# Patient Record
Sex: Female | Born: 1950 | Race: White | Hispanic: No | Smoking: Never smoker
Health system: Southern US, Community
[De-identification: ages and names within clinical notes are randomized; demographics above are authoritative.]

## PROBLEM LIST (undated history)

## (undated) DIAGNOSIS — E039 Hypothyroidism, unspecified: Secondary | ICD-10-CM

## (undated) HISTORY — PX: CLOSED REDUCTION SHOULDER DISLOCATION: SUR242

## (undated) HISTORY — PX: BREAST BIOPSY: SHX20

---

## 1997-09-18 ENCOUNTER — Other Ambulatory Visit: Admission: RE | Admit: 1997-09-18 | Discharge: 1997-09-18 | Payer: Self-pay | Admitting: Obstetrics and Gynecology

## 2018-01-30 ENCOUNTER — Other Ambulatory Visit: Payer: Self-pay | Admitting: Family Medicine

## 2018-01-30 DIAGNOSIS — Z1231 Encounter for screening mammogram for malignant neoplasm of breast: Secondary | ICD-10-CM

## 2018-01-30 DIAGNOSIS — Z13820 Encounter for screening for osteoporosis: Secondary | ICD-10-CM

## 2018-02-25 ENCOUNTER — Encounter: Payer: Self-pay | Admitting: Family Medicine

## 2018-03-01 ENCOUNTER — Other Ambulatory Visit: Payer: Self-pay

## 2018-03-01 DIAGNOSIS — R195 Other fecal abnormalities: Secondary | ICD-10-CM

## 2018-03-01 DIAGNOSIS — Z1211 Encounter for screening for malignant neoplasm of colon: Secondary | ICD-10-CM

## 2018-03-21 ENCOUNTER — Encounter: Payer: Self-pay | Admitting: Student

## 2018-03-22 ENCOUNTER — Encounter
Admission: RE | Disposition: A | Discharge status: Home / Self Care | Payer: Self-pay | Source: Home / Self Care | Attending: Gastroenterology

## 2018-03-22 ENCOUNTER — Encounter: Payer: Self-pay | Admitting: *Deleted

## 2018-03-22 ENCOUNTER — Ambulatory Visit: Payer: Medicare Other | Admitting: Certified Registered"

## 2018-03-22 ENCOUNTER — Ambulatory Visit
Admission: RE | Admit: 2018-03-22 | Discharge: 2018-03-22 | Disposition: A | Discharge status: Home / Self Care | Payer: Medicare Other | Attending: Gastroenterology | Admitting: Gastroenterology

## 2018-03-22 DIAGNOSIS — D122 Benign neoplasm of ascending colon: Secondary | ICD-10-CM

## 2018-03-22 DIAGNOSIS — E039 Hypothyroidism, unspecified: Secondary | ICD-10-CM | POA: Diagnosis not present

## 2018-03-22 DIAGNOSIS — K640 First degree hemorrhoids: Secondary | ICD-10-CM | POA: Insufficient documentation

## 2018-03-22 DIAGNOSIS — D125 Benign neoplasm of sigmoid colon: Secondary | ICD-10-CM | POA: Insufficient documentation

## 2018-03-22 DIAGNOSIS — R195 Other fecal abnormalities: Secondary | ICD-10-CM | POA: Insufficient documentation

## 2018-03-22 DIAGNOSIS — Z79890 Hormone replacement therapy (postmenopausal): Secondary | ICD-10-CM | POA: Diagnosis not present

## 2018-03-22 DIAGNOSIS — D120 Benign neoplasm of cecum: Secondary | ICD-10-CM | POA: Insufficient documentation

## 2018-03-22 DIAGNOSIS — D124 Benign neoplasm of descending colon: Secondary | ICD-10-CM | POA: Insufficient documentation

## 2018-03-22 HISTORY — DX: Hypothyroidism, unspecified: E03.9

## 2018-03-22 HISTORY — PX: COLONOSCOPY WITH PROPOFOL: SHX5780

## 2018-03-22 SURGERY — COLONOSCOPY WITH PROPOFOL
Anesthesia: General

## 2018-03-22 MED ORDER — SODIUM CHLORIDE 0.9 % IV SOLN
INTRAVENOUS | Status: DC
  Administered 2018-03-22: 1000 mL via INTRAVENOUS

## 2018-03-22 MED ORDER — PROPOFOL 10 MG/ML IV BOLUS
INTRAVENOUS | Status: DC | PRN
  Administered 2018-03-22 (×2): 20 mg via INTRAVENOUS
  Administered 2018-03-22: 50 mg via INTRAVENOUS
  Administered 2018-03-22 (×10): 20 mg via INTRAVENOUS
  Administered 2018-03-22: 50 mg via INTRAVENOUS
  Administered 2018-03-22: 20 mg via INTRAVENOUS

## 2018-03-22 NOTE — Anesthesia Preprocedure Evaluation (Signed)
Anesthesia Evaluation  Patient identified by MRN, date of birth, ID band Patient awake    Reviewed: Allergy & Precautions, NPO status , Patient's Chart, lab work & pertinent test results  History of Anesthesia Complications Negative for: history of anesthetic complications  Airway Mallampati: II  TM Distance: >3 FB Neck ROM: Full    Dental no notable dental hx.    Pulmonary neg pulmonary ROS, neg sleep apnea, neg COPD,    breath sounds clear to auscultation- rhonchi (-) wheezing      Cardiovascular Exercise Tolerance: Good (-) hypertension(-) CAD and (-) Past MI  Rhythm:Regular Rate:Normal - Systolic murmurs and - Diastolic murmurs    Neuro/Psych negative neurological ROS  negative psych ROS   GI/Hepatic negative GI ROS, Neg liver ROS,   Endo/Other  neg diabetesHypothyroidism   Renal/GU negative Renal ROS     Musculoskeletal negative musculoskeletal ROS (+)   Abdominal (+) - obese,   Peds  Hematology negative hematology ROS (+)   Anesthesia Other Findings   Reproductive/Obstetrics                             Anesthesia Physical Anesthesia Plan  ASA: II  Anesthesia Plan: General   Post-op Pain Management:    Induction: Intravenous  PONV Risk Score and Plan: 2 and Propofol infusion  Airway Management Planned: Natural Airway  Additional Equipment:   Intra-op Plan:   Post-operative Plan:   Informed Consent: I have reviewed the patients History and Physical, chart, labs and discussed the procedure including the risks, benefits and alternatives for the proposed anesthesia with the patient or authorized representative who has indicated his/her understanding and acceptance.     Dental advisory given  Plan Discussed with: CRNA and Anesthesiologist  Anesthesia Plan Comments:         Anesthesia Quick Evaluation

## 2018-03-22 NOTE — Anesthesia Postprocedure Evaluation (Signed)
Anesthesia Post Note  Patient: Shirley Christian  Procedure(s) Performed: COLONOSCOPY WITH PROPOFOL (N/A )  Patient location during evaluation: Endoscopy Anesthesia Type: General Level of consciousness: awake and alert and oriented Pain management: pain level controlled Vital Signs Assessment: post-procedure vital signs reviewed and stable Respiratory status: spontaneous breathing, nonlabored ventilation and respiratory function stable Cardiovascular status: blood pressure returned to baseline and stable Postop Assessment: no signs of nausea or vomiting Anesthetic complications: no     Last Vitals:  Vitals:   03/22/18 1002 03/22/18 1012  BP: 124/71 126/69  Pulse: 82 77  Resp: 16 13  Temp:    SpO2: 100% 100%    Last Pain:  Vitals:   03/22/18 1012  TempSrc:   PainSc: 0-No pain                 [name]

## 2018-03-22 NOTE — Transfer of Care (Signed)
Immediate Anesthesia Transfer of Care Note  Patient: Shirley Christian  Procedure(s) Performed: COLONOSCOPY WITH PROPOFOL (N/A )  Patient Location: PACU and Endoscopy Unit  Anesthesia Type:General  Level of Consciousness: awake, alert , oriented and patient cooperative  Airway & Oxygen Therapy: Patient Spontanous Breathing and Patient connected to nasal cannula oxygen  Post-op Assessment: Report given to RN and Post -op Vital signs reviewed and stable  Post vital signs: Reviewed and stable  Last Vitals:  Vitals Value Taken Time  BP 116/68 03/22/2018  9:43 AM  Temp 36.2 C 03/22/2018  9:42 AM  Pulse 94 03/22/2018  9:43 AM  Resp 18 03/22/2018  9:43 AM  SpO2 100 % 03/22/2018  9:43 AM  Vitals shown include unvalidated device data.  Last Pain:  Vitals:   03/22/18 0942  TempSrc: Tympanic  PainSc: 0-No pain         Complications: No apparent anesthesia complications

## 2018-03-22 NOTE — H&P (Addendum)
Jonathon Bellows, MD [address] [place], [place], [place], [address] [address], [place], [place], [place], [address] Phone: [phone]  Fax: [phone]  Primary Care Physician:  Tandy Gaw, [place]   Pre-Procedure History & Physical: HPI:  Shirley Christian is a 68 y.o. female is here for an colonoscopy.   Past Medical History:  Diagnosis Date  . Hypothyroidism     Past Surgical History:  Procedure Laterality Date  . CLOSED REDUCTION SHOULDER DISLOCATION      Prior to Admission medications   Medication Sig Start Date End Date Taking? Authorizing Provider  levothyroxine (SYNTHROID, LEVOTHROID) 75 MCG tablet Take 75 mcg by mouth daily before breakfast.   Yes [provider]    Allergies as of 03/01/2018  . (Not on File)    History reviewed. No pertinent family history.  Social History   Socioeconomic History  . Marital status: Divorced    Spouse name: Not on file  . Number of children: Not on file  . Years of education: Not on file  . Highest education level: Not on file  Occupational History  . Not on file  Social Needs  . Financial resource strain: Not on file  . Food insecurity:    Worry: Not on file    Inability: Not on file  . Transportation needs:    Medical: Not on file    Non-medical: Not on file  Tobacco Use  . Smoking status: Never Smoker  . Smokeless tobacco: Never Used  Substance and Sexual Activity  . Alcohol use: Not on file  . Drug use: Not on file  . Sexual activity: Not on file  Lifestyle  . Physical activity:    Days per week: Not on file    Minutes per session: Not on file  . Stress: Not on file  Relationships  . Social connections:    Talks on phone: Not on file    Gets together: Not on file    Attends religious service: Not on file    Active member of club or organization: Not on file    Attends meetings of clubs or organizations: Not on file    Relationship status: Not on file  . Intimate partner  violence:    Fear of current or ex partner: Not on file    Emotionally abused: Not on file    Physically abused: Not on file    Forced sexual activity: Not on file  Other Topics Concern  . Not on file  Social History Narrative  . Not on file    Review of Systems: See HPI, otherwise negative ROS  Physical Exam: BP (!) 150/81   Pulse 95   Temp (!) 97.5 F (36.4 C) (Tympanic)   Resp 18   Ht 5\' 4"  (1.626 m)   Wt 73.5 kg   SpO2 100%   BMI 27.81 kg/m  General:   Alert,  pleasant and cooperative in NAD Head:  Normocephalic and atraumatic. Neck:  Supple; no masses or thyromegaly. Lungs:  Clear throughout to auscultation, normal respiratory effort.    Heart:  +S1, +S2, Regular rate and rhythm, No edema. Abdomen:  Soft, nontender and nondistended. Normal bowel sounds, without guarding, and without rebound.   Neurologic:  Alert and  oriented x4;  grossly normal neurologically.  Impression/Plan: Shirley Christian is here for an colonoscopy to be performed for Stool occult test positive average risk   Risks, benefits, limitations, and alternatives regarding  colonoscopy have  been reviewed with the patient.  Questions have been answered.  All parties agreeable.   Jonathon Bellows, MD  03/22/2018, 9:02 AM

## 2018-03-22 NOTE — Op Note (Addendum)
[facility] Gastroenterology Patient Name: Shirley Christian Procedure Date: 03/22/2018 9:14 AM MRN: [id number] Account #: [id number] Date of Birth: 1950-09-24 Admit Type: Outpatient Age: 69 Room: [facility] ENDO ROOM 4 Gender: Female Note Status: Finalized Procedure:            Colonoscopy Indications:          Positive fecal immunochemical test Providers:            Jonathon Bellows MD, MD Referring MD:         No Local Md, MD (Referring MD) Medicines:            Monitored Anesthesia Care Complications:        No immediate complications. Procedure:            Pre-Anesthesia Assessment:                       - Prior to the procedure, a History and Physical was                        performed, and patient medications, allergies and                        sensitivities were reviewed. The patient's tolerance of                        previous anesthesia was reviewed.                       - The risks and benefits of the procedure and the                        sedation options and risks were discussed with the                        patient. All questions were answered and informed                        consent was obtained.                       - ASA Grade Assessment: II - A patient with mild                        systemic disease.                       After obtaining informed consent, the colonoscope was                        passed under direct vision. Throughout the procedure,                        the patient's blood pressure, pulse, and oxygen                        saturations were monitored continuously. The                        Colonoscope was introduced through the anus and  advanced to the the cecum, identified by the                        appendiceal orifice, IC valve and transillumination.                        The colonoscopy was performed with ease. The patient                        tolerated the procedure well. The quality of the  bowel                        preparation was good. Findings:      The perianal and digital rectal examinations were normal.      Non-bleeding internal hemorrhoids were found during retroflexion. The       hemorrhoids were medium-sized and Grade I (internal hemorrhoids that do       not prolapse).      A 12 mm polyp was found in the sigmoid colon. The polyp was       semi-pedunculated. The polyp was removed with a hot snare. Resection and       retrieval were complete. To prevent bleeding after the polypectomy, one       hemostatic clip was successfully placed. There was no bleeding at the       end of the procedure.      Three sessile polyps were found in the cecum. The polyps were 4 to 6 mm       in size. These polyps were removed with a cold snare. Resection and       retrieval were complete.      Four sessile polyps were found in the ascending colon. The polyps were 5       to 7 mm in size. These polyps were removed with a cold snare. Resection       and retrieval were complete.      A 4 mm polyp was found in the descending colon. The polyp was sessile.       The polyp was removed with a cold snare. Resection and retrieval were       complete.      The exam was otherwise without abnormality on direct and retroflexion       views. Impression:           - Non-bleeding internal hemorrhoids.                       - One 12 mm polyp in the sigmoid colon, removed with a                        hot snare. Resected and retrieved. Clip was placed.                       - Three 4 to 6 mm polyps in the cecum, removed with a                        cold snare. Resected and retrieved.                       - Four 5 to 7 mm polyps in the ascending colon, removed  with a cold snare. Resected and retrieved.                       - One 4 mm polyp in the descending colon, removed with                        a cold snare. Resected and retrieved.                       - The  examination was otherwise normal on direct and                        retroflexion views. Recommendation:       - Discharge patient to home (with escort).                       - Resume previous diet.                       - Continue present medications.                       - Await pathology results.                       - Repeat colonoscopy in 3 years for surveillance. Procedure Code(s):    --- Professional ---                       [phone], Colonoscopy, flexible; with removal of tumor(s),                        polyp(s), or other lesion(s) by snare technique Diagnosis Code(s):    --- Professional ---                       D12.5, Benign neoplasm of sigmoid colon                       D12.4, Benign neoplasm of descending colon                       D12.0, Benign neoplasm of cecum                       D12.2, Benign neoplasm of ascending colon                       K64.0, First degree hemorrhoids                       R19.5, Other fecal abnormalities CPT copyright 2018 American Medical Association. All rights reserved. The codes documented in this report are preliminary and upon coder review may  be revised to meet current compliance requirements. Jonathon Bellows, MD Jonathon Bellows MD, MD 03/22/2018 9:40:33 AM This report has been signed electronically. Number of Addenda: 0 Note Initiated On: 03/22/2018 9:14 AM Scope Withdrawal Time: 0 hours 16 minutes 33 seconds  Total Procedure Duration: 0 hours 19 minutes 25 seconds       [facility]

## 2018-03-22 NOTE — Anesthesia Post-op Follow-up Note (Signed)
Anesthesia QCDR form completed.        

## 2018-03-25 ENCOUNTER — Encounter: Payer: Self-pay | Admitting: Gastroenterology

## 2018-03-25 LAB — SURGICAL PATHOLOGY

## 2018-10-17 ENCOUNTER — Encounter (INDEPENDENT_AMBULATORY_CARE_PROVIDER_SITE_OTHER): Payer: Medicare Other | Admitting: Ophthalmology

## 2018-10-30 ENCOUNTER — Other Ambulatory Visit: Payer: Self-pay

## 2018-10-30 ENCOUNTER — Encounter (INDEPENDENT_AMBULATORY_CARE_PROVIDER_SITE_OTHER): Payer: Medicare Other | Admitting: Ophthalmology

## 2018-10-30 DIAGNOSIS — H2512 Age-related nuclear cataract, left eye: Secondary | ICD-10-CM

## 2018-10-30 DIAGNOSIS — H43813 Vitreous degeneration, bilateral: Secondary | ICD-10-CM

## 2018-10-30 DIAGNOSIS — H35371 Puckering of macula, right eye: Secondary | ICD-10-CM

## 2019-04-29 ENCOUNTER — Encounter (INDEPENDENT_AMBULATORY_CARE_PROVIDER_SITE_OTHER): Payer: Medicare Other | Admitting: Ophthalmology

## 2020-02-02 ENCOUNTER — Other Ambulatory Visit: Payer: Self-pay | Admitting: Family Medicine

## 2020-02-02 DIAGNOSIS — Z1231 Encounter for screening mammogram for malignant neoplasm of breast: Secondary | ICD-10-CM

## 2021-02-09 ENCOUNTER — Other Ambulatory Visit: Payer: Self-pay | Admitting: Family Medicine

## 2021-02-09 DIAGNOSIS — Z1231 Encounter for screening mammogram for malignant neoplasm of breast: Secondary | ICD-10-CM

## 2021-03-01 ENCOUNTER — Other Ambulatory Visit: Payer: Self-pay | Admitting: Family Medicine

## 2021-03-01 DIAGNOSIS — Z13820 Encounter for screening for osteoporosis: Secondary | ICD-10-CM

## 2021-04-15 ENCOUNTER — Ambulatory Visit
Admission: RE | Admit: 2021-04-15 | Discharge: 2021-04-15 | Disposition: A | Discharge status: Home / Self Care | Payer: Medicare Other | Source: Ambulatory Visit | Attending: Family Medicine | Admitting: Family Medicine

## 2021-04-15 ENCOUNTER — Other Ambulatory Visit: Payer: Self-pay

## 2021-04-15 DIAGNOSIS — Z1231 Encounter for screening mammogram for malignant neoplasm of breast: Secondary | ICD-10-CM | POA: Diagnosis present

## 2021-04-18 ENCOUNTER — Other Ambulatory Visit: Payer: Self-pay | Admitting: Family Medicine

## 2021-04-20 ENCOUNTER — Other Ambulatory Visit: Payer: Self-pay | Admitting: Family Medicine

## 2021-04-20 DIAGNOSIS — N6489 Other specified disorders of breast: Secondary | ICD-10-CM

## 2021-04-20 DIAGNOSIS — R928 Other abnormal and inconclusive findings on diagnostic imaging of breast: Secondary | ICD-10-CM

## 2021-05-05 ENCOUNTER — Ambulatory Visit
Admission: RE | Admit: 2021-05-05 | Discharge: 2021-05-05 | Disposition: A | Discharge status: Home / Self Care | Payer: Medicare Other | Source: Ambulatory Visit | Attending: Family Medicine | Admitting: Family Medicine

## 2021-05-05 ENCOUNTER — Other Ambulatory Visit: Payer: Self-pay | Admitting: Family Medicine

## 2021-05-05 ENCOUNTER — Other Ambulatory Visit: Payer: Self-pay

## 2021-05-05 DIAGNOSIS — N6489 Other specified disorders of breast: Secondary | ICD-10-CM

## 2021-05-05 DIAGNOSIS — R928 Other abnormal and inconclusive findings on diagnostic imaging of breast: Secondary | ICD-10-CM

## 2021-05-05 DIAGNOSIS — N6091 Unspecified benign mammary dysplasia of right breast: Secondary | ICD-10-CM

## 2021-05-05 HISTORY — DX: Unspecified benign mammary dysplasia of right breast: N60.91

## 2021-05-10 ENCOUNTER — Other Ambulatory Visit: Payer: Self-pay | Admitting: Family Medicine

## 2021-05-10 DIAGNOSIS — N6489 Other specified disorders of breast: Secondary | ICD-10-CM

## 2021-05-10 DIAGNOSIS — R928 Other abnormal and inconclusive findings on diagnostic imaging of breast: Secondary | ICD-10-CM

## 2021-05-24 ENCOUNTER — Ambulatory Visit
Admission: RE | Admit: 2021-05-24 | Discharge: 2021-05-24 | Disposition: A | Discharge status: Home / Self Care | Payer: Medicare Other | Source: Ambulatory Visit | Attending: Family Medicine | Admitting: Family Medicine

## 2021-05-24 ENCOUNTER — Other Ambulatory Visit: Payer: Self-pay

## 2021-05-24 DIAGNOSIS — R928 Other abnormal and inconclusive findings on diagnostic imaging of breast: Secondary | ICD-10-CM

## 2021-05-24 DIAGNOSIS — N6489 Other specified disorders of breast: Secondary | ICD-10-CM

## 2021-05-24 HISTORY — PX: BREAST BIOPSY: SHX20

## 2021-05-26 LAB — SURGICAL PATHOLOGY

## 2021-06-10 ENCOUNTER — Ambulatory Visit: Payer: Medicare Other | Admitting: Surgery

## 2021-06-17 ENCOUNTER — Encounter: Payer: Self-pay | Admitting: Surgery

## 2021-06-17 ENCOUNTER — Ambulatory Visit: Payer: Medicare Other | Admitting: Surgery

## 2021-06-17 VITALS — BP 194/93 | HR 88 | Temp 97.8°F | Ht 64.0 in | Wt 179.6 lb

## 2021-06-17 DIAGNOSIS — N6091 Unspecified benign mammary dysplasia of right breast: Secondary | ICD-10-CM

## 2021-06-17 NOTE — Patient Instructions (Addendum)
Our surgery scheduler Shirley Christian will call you within 24-48 hours to get you scheduled. If you have not heard from her after 48 hours, please call our office. Have the blue sheet available when she calls to write down important information. ? ? ?If you have any concerns or questions, please feel free to call our office.  ? ?Lumpectomy ? ?A lumpectomy, sometimes called a partial mastectomy, is surgery to remove a cancerous tumor or mass (the lump) from a breast. It is a form of breast-conserving or breast-preservation surgery. This means that the cancerous tissue is removed but the breast remains intact. ?During a lumpectomy, the portion of the breast that contains the tumor is removed. Some normal tissue around the lump may be taken out to make sure that all of the tumor has been removed. Lymph nodes under your arm may also be removed and tested to find out if the cancer has spread. Lymph nodes are part of the body's disease-fighting system (immune system) and are usually the first place where breast cancer spreads. ?Tell a health care provider about: ?Any allergies you have. ?All medicines you are taking, including vitamins, herbs, eye drops, creams, and over-the-counter medicines. ?Any problems you or family members have had with anesthetic medicines. ?Any blood disorders you have. ?Any surgeries you have had. ?Any medical conditions you have. ?Whether you are pregnant or may be pregnant. ?What are the risks? ?Generally, this is a safe procedure. However, problems may occur, including: ?Bleeding. ?Infection. ?Allergic reaction to medicines. ?Pain, swelling, weakness, or numbness in the arm on the side of your surgery. ?Temporary swelling. ?Change in the shape of the breast, particularly if a large portion is removed. ?Scar tissue that forms at the surgical site and feels hard to the touch. ?Blood clots. ?What happens before the procedure? ?Staying hydrated ?Follow instructions from your health care provider about  hydration, which may include: ?Up to 2 hours before the procedure - you may continue to drink clear liquids, such as water, clear fruit juice, black coffee, and plain tea. ? ?Eating and drinking restrictions ?Follow instructions from your health care provider about eating and drinking, which may include: ?8 hours before the procedure - stop eating heavy meals or foods, such as meat, fried foods, or fatty foods. ?6 hours before the procedure - stop eating light meals or foods, such as toast or cereal. ?6 hours before the procedure - stop drinking milk or drinks that contain milk. ?2 hours before the procedure - stop drinking clear liquids. ?Medicines ?Ask your health care provider about: ?Changing or stopping your regular medicines. This is especially important if you are taking diabetes medicines or blood thinners. ?Taking medicines such as aspirin and ibuprofen. These medicines can thin your blood. Do not take these medicines unless your health care provider tells you to take them. ?Taking over-the-counter medicines, vitamins, herbs, and supplements. ?General instructions ?Prior to surgery, your health care provider may do a procedure to locate and mark the tumor area in your breast (localization). This will help guide your surgeon to where the incision will be made. This may be done with: ?Imaging, such as a mammogram, ultrasound, or MRI. ?Insertion of a small wire, clip, or seed, or an implant that will reflect a radar signal. ?You may have screening tests or exams to get baseline measurements of your arm. These can be compared to measurements done after surgery to monitor for swelling (lymphedema) that can develop after having lymph nodes removed. ?Ask your health care provider: ?How  your surgery site will be marked. ?What steps will be taken to help prevent infection. These may include: ?Washing skin with a germ-killing soap. ?Taking antibiotic medicine. ?Plan to have someone take you home from the hospital or  clinic. ?Plan to have a responsible adult care for you for at least 24 hours after you leave the hospital or clinic. This is important. ?What happens during the procedure? ? ?An IV will be inserted into one of your veins. ?You will be given one or more of the following: ?A medicine to help you relax (sedative). ?A medicine to numb the area (local anesthetic). ?A medicine to make you fall asleep (general anesthetic). ?Your health care provider will use a kind of electric scalpel that uses heat to reduce bleeding (electrocautery knife). A curved incision that follows the natural curve of your breast will be made. This type of incision will allow for minimal scarring and better healing. ?The tumor will be removed along with some of the tissue around it. This will be sent to the lab for testing. Your health care provider may also remove lymph nodes at this time if needed. ?If the tumor is close to the muscles over your chest, some muscle tissue may also be removed. ?A small drain tube may be inserted into your breast area or armpit to collect fluid that may build up after surgery. This tube will be connected to a suction bulb on the outside of your body to remove the fluid. ?The incision will be closed with stitches (sutures). ?A bandage (dressing) may be placed over the incision. ?The procedure may vary among health care providers and hospitals. ?What happens after the procedure? ?Your blood pressure, heart rate, breathing rate, and blood oxygen level will be monitored until you leave the hospital or clinic. ?You will be given medicine for pain as needed. ?Your IV will be removed when you are able to eat and drink by mouth. ?You will be encouraged to get up and walk as soon as you can. This is important to improve blood flow and breathing. Ask for help if you feel weak or unsteady. ?You may have: ?A drain tube in place for 2-3 days to prevent a collection of blood (hematoma) from developing in the breast. You will be  given instructions about caring for the drain before you go home. ?A pressure bandage applied for 1-2 days to prevent bleeding or swelling. Your pressure bandage may look like a thick piece of fabric or an elastic wrap. Ask your health care provider how to care for your bandage at home. ?You may be given a tight sleeve to wear over your arm on the side of your surgery. You should wear this sleeve as told by your health care provider. ?Do not drive for 24 hours if you were given a sedative during your procedure. ?Summary ?A lumpectomy, sometimes called a partial mastectomy, is surgery to remove a cancerous tumor or mass (the lump) from a breast. ?During a lumpectomy, the portion of the breast that contains the tumor is removed. Lymph nodes under your arm may also be removed and tested to find out if the cancer has spread. ?Plan to have someone take you home from the hospital or clinic. ?You may have a drain tube in place for 2-3 days to prevent a collection of blood (hematoma) from developing in the breast. You will be given instructions about caring for the drain before you go home. ?This information is not intended to replace advice given to  you by your health care provider. Make sure you discuss any questions you have with your health care provider. ?Document Revised: 08/19/2018 Document Reviewed: 08/19/2018 ?Elsevier Patient Education ? [employer]. ? ?

## 2021-06-17 NOTE — Progress Notes (Signed)
?06/17/2021 ? ?Reason for Visit:  Right breast atypical ductal hyperplasia ? ?Requesting Provider:  Al Pimple, RN ? ?History of Present Illness: ?Shirley Christian is a 71 y.o. female presenting for evaluation of right breast atypical ductal hyperplasia.  The patient had an initial screening mammogram on 04/15/2021 showing asymmetries in bilateral breasts.  Diagnostic mammogram on 05/05/2021 showed a persistent right breast asymmetry but less prominent asymmetry in the left upper posterior breast.  A right breast biopsy was done on 05/24/21 and this resulted in atypical ductal hyperplasia with sclerosing adenosis and negative for ductal carcinoma in situ or malignancy.  With regards to the left breast asymmetry, since this was less prominent on the diagnostic mammogram, it was recommended that she had a repeat left breast diagnostic mammogram in 6 months. ? ?The patient reports that she did not palpate this area of concern in the right breast.  Denies any skin changes to the breast or nipple changes.  Denies any drainage or pain.  There is no family history of breast cancer.  She is postmenopausal and had been on birth control in the past.  Menses started at age 34 and she is G2, P2 with her first pregnancy at age 29. ? ?Past Medical History: ?Past Medical History:  ?Diagnosis Date  ? Hypothyroidism   ?  ? ?Past Surgical History: ?Past Surgical History:  ?Procedure Laterality Date  ? BREAST BIOPSY    ? BREAST BIOPSY Right 05/24/2021  ? stereo bx, asymmetry, "X" clip-path pending  ? CLOSED REDUCTION SHOULDER DISLOCATION    ? COLONOSCOPY WITH PROPOFOL N/A 03/22/2018  ? Procedure: COLONOSCOPY WITH PROPOFOL;  Surgeon: Jonathon Bellows, MD;  Location: [facility] ENDOSCOPY;  Service: Gastroenterology;  Laterality: N/A;  ? ? ?Home Medications: ?Prior to Admission medications   ?Medication Sig Start Date End Date Taking? Authorizing Provider  ?levothyroxine (SYNTHROID, LEVOTHROID) 75 MCG tablet Take 75 mcg by mouth daily before  breakfast.   Yes [provider]  ? ? ?Allergies: ?Allergies  ?Allergen Reactions  ? Penicillins Swelling  ? ? ?Social History: ? reports that she has never smoked. She has never used smokeless tobacco. No history on file for alcohol use and drug use.  ? ?Family History: ?Family History  ?Problem Relation Age of Onset  ? Breast cancer Neg Hx   ? ? ?Review of Systems: ?Review of Systems  ?Constitutional:  Negative for chills and fever.  ?HENT:  Negative for hearing loss.   ?Respiratory:  Negative for shortness of breath.   ?Cardiovascular:  Negative for chest pain.  ?Gastrointestinal:  Negative for abdominal pain, nausea and vomiting.  ?Genitourinary:  Negative for dysuria.  ?Musculoskeletal:  Negative for myalgias.  ?Skin:  Negative for rash.  ?Neurological:  Negative for dizziness.  ?Psychiatric/Behavioral:  Negative for depression.   ? ?Physical Exam ?BP (!) 194/93   Pulse 88   Temp 97.8 ?F (36.6 ?C) (Oral)   Ht '5\' 4"'$  (1.626 m)   Wt 179 lb 9.6 oz (81.5 kg)   SpO2 98%   BMI 30.83 kg/m?  ?CONSTITUTIONAL: No acute distress, well-nourished ?HEENT:  Normocephalic, atraumatic, extraocular motion intact. ?NECK: Trachea is midline, and there is no jugular venous distension.  ?RESPIRATORY:  Lungs are clear, and breath sounds are equal bilaterally. Normal respiratory effort without pathologic use of accessory muscles. ?CARDIOVASCULAR: Heart is regular without murmurs, gallops, or rubs. ?BREAST: Patient declined breast exam today. ?MUSCULOSKELETAL:  Normal muscle strength and tone in all four extremities.  No peripheral edema or cyanosis. ?SKIN: Skin  turgor is normal. There are no pathologic skin lesions.  ?NEUROLOGIC:  Motor and sensation is grossly normal.  Cranial nerves are grossly intact. ?PSYCH:  Alert and oriented to person, place and time. Affect is normal. ? ?Laboratory Analysis: ?Right breast biopsy on 05/24/2021: ?DIAGNOSIS:  ?A. BREAST, RIGHT UPPER OUTER QUADRANT; STEREOTACTIC CORE NEEDLE BIOPSY:   ?- ATYPICAL DUCTAL HYPERPLASIA, INVOLVING SMALL FOCUS OF SCLEROSING ADENOSIS.  ?- BACKGROUND MAMMARY PARENCHYMA WITH STROMAL FIBROSIS, AND FIBROCYSTIC AND APOCRINE CHANGES.  ?- NEGATIVE FOR DUCTAL CARCINOMA IN SITU AND MALIGNANCY.  ? ?Imaging: ?Bilateral mammogram and ultrasound on 05/05/2021: ?FINDINGS: ?Spot compression cc and MLO views of the left breast, spot compression right MLO view, lateral views of bilateral breasts are ?submitted. The previously noted asymmetry in the upper right breast ?is persistent. The previously noted asymmetry in the medial left breast is persistent. The previously noted asymmetry in the upper posterior left breast is less prominent on additional views. ?  ?Targeted ultrasound is performed, showing a cluster of cysts at the left breast 8 o'clock 4 cm from nipple measuring 5 x 2 x 4 mm correlating to the asymmetry noted in the medial left breast. Ultrasound of the upper left breast does not demonstrate focal discrete cystic or solid lesion that would correlate to callback area of question in the upper posterior left breast. ?  ?Ultrasound of the right axilla is negative. ?  ?IMPRESSION: ?Suspicious findings. ?  ?RECOMMENDATION: ?Recommend stereotactic core biopsy of right breast asymmetry. If the ?right breast asymmetry biopsy result is negative, recommend six-month follow-up mammogram left breast for upper posterior left ?breast asymmetry. If the right breast asymmetry biopsy result is positive for malignancy, recommend a stereotactic core biopsy for ?left breast upper posterior asymmetry. ?  ?I have discussed the findings and recommendations with the patient. ?If applicable, a reminder letter will be sent to the patient regarding the next appointment. ?  ?BI-RADS CATEGORY  4: Suspicious. ? ?Assessment and Plan: ?This is a 71 y.o. female with right breast atypical ductal hyperplasia. ? ?- Discussed with the patient the findings on her mammograms as well as the biopsy results.  Overall  discussed with her that she does not have a diagnosis of cancer but instead of atypical ductal hyperplasia.  Unfortunately this does increase her risk of breast cancer but also there is a possibility that this area of ADH could be hiding an area of malignancy such as DCIS.  As such, the recommendation would be to excise this area.  The patient is in agreement but she would like to wait a few weeks when her sister is coming up to be with her for the surgery.  At this point, since there is no diagnosis of cancer, I think that is reasonable to wait. ?- Discussed with her briefly the plans for surgery that we would do otherwise.  Discussed with her that this would be a lumpectomy and that prior to surgery, we would need to place a radiofrequency tag in the biopsy area to help [place] localize this area better during surgery.  This has to be done within 30 days of surgery.  Discussed with her that this would be an outpatient surgery and reviewed with her the risks of bleeding, infection, injury to surrounding structures, postoperative recovery and the use of a breast binder afterwards.  Also discussed with her that after surgery, we will do a referral to the oncology team to discuss with her potential endocrine therapy afterwards pending that this is only ADH.  Discussed with her that if this does get upgraded to DCIS or cancer, that her postoperative plans would be different may include checking lymph nodes or radiation. ?- The patient will contact her sister to see exactly when she is coming up and will call us back to set up a date for surgery.  She understands that if this is going to be more than 30 days from today's visit, we will need to have a preop appointment for H&P update.  Given that today she has declined a breast exam, discussed with her that on her next appointment, we will need to do a breast exam. ?- All questions have been answered. ? ?I spent 60 minutes dedicated to the care of this patient on the date of  this encounter to include pre-visit review of records, face-to-face time with the patient discussing diagnosis and management, and any post-visit coordination of care. ? ? ?Melvyn Neth, MD ?[facility]

## 2021-06-20 ENCOUNTER — Telehealth: Payer: Self-pay | Admitting: Surgery

## 2021-06-20 NOTE — Telephone Encounter (Signed)
Patient has been advised of Pre-Admission date/time, COVID Testing date and Surgery date. ? ?Surgery Date: 08/11/21 ?Preadmission Testing Date: 08/02/21 (phone 1p-5p) ?Covid Testing Date: Not needed.    ? ?Patient has been made aware to call [phone], between 1-3:00pm the day before surgery, to find out what time to arrive for surgery.   ? ?

## 2021-06-21 ENCOUNTER — Other Ambulatory Visit: Payer: Self-pay | Admitting: Surgery

## 2021-06-21 DIAGNOSIS — N6091 Unspecified benign mammary dysplasia of right breast: Secondary | ICD-10-CM

## 2021-06-21 DIAGNOSIS — R928 Other abnormal and inconclusive findings on diagnostic imaging of breast: Secondary | ICD-10-CM

## 2021-08-02 ENCOUNTER — Ambulatory Visit
Admission: RE | Admit: 2021-08-02 | Discharge: 2021-08-02 | Disposition: A | Discharge status: Home / Self Care | Payer: Medicare Other | Source: Ambulatory Visit | Attending: Surgery | Admitting: Surgery

## 2021-08-02 ENCOUNTER — Encounter
Admission: RE | Admit: 2021-08-02 | Discharge: 2021-08-02 | Disposition: A | Discharge status: Home / Self Care | Payer: Medicare Other | Source: Ambulatory Visit | Attending: Surgery | Admitting: Surgery

## 2021-08-02 DIAGNOSIS — R928 Other abnormal and inconclusive findings on diagnostic imaging of breast: Secondary | ICD-10-CM | POA: Insufficient documentation

## 2021-08-02 DIAGNOSIS — Z01812 Encounter for preprocedural laboratory examination: Secondary | ICD-10-CM | POA: Diagnosis present

## 2021-08-02 DIAGNOSIS — N6091 Unspecified benign mammary dysplasia of right breast: Secondary | ICD-10-CM | POA: Diagnosis present

## 2021-08-02 NOTE — Patient Instructions (Addendum)
Your procedure is scheduled on: Thursday August 11, 2021 Report to Day Surgery inside [facility] 2nd floor, stop by admissions desk before getting on elevator.  To find out your arrival time please call [phone] between 1PM - 3PM on Wednesday August 10, 2021.  Remember: Instructions that are not followed completely may result in serious medical risk,  up to and including death, or upon the discretion of your surgeon and anesthesiologist your  surgery may need to be rescheduled.     _X__ 1. Do not eat food after midnight the night before your procedure.                 No chewing gum or hard candies. You may drink clear liquids up to 2 hours                 before you are scheduled to arrive for your surgery- DO not drink clear                 liquids within 2 hours of the start of your surgery.                 Clear Liquids include:  water, apple juice without pulp, clear Gatorade, G2 or                  Gatorade Zero (avoid Red/Purple/Blue), Black Coffee or Tea (Do not add                 anything to coffee or tea).  __X__2.  On the morning of surgery brush your teeth with toothpaste and water, you                may rinse your mouth with mouthwash if you wish.  Do not swallow any toothpaste or mouthwash.     _X__ 3.  No Alcohol for 24 hours before or after surgery.   _X__ 4.  Do Not Smoke or use e-cigarettes For 24 Hours Prior to Your Surgery.                 Do not use any chewable tobacco products for at least 6 hours prior to                 Surgery.  _X__  5.  Do not use any recreational drugs (marijuana, cocaine, heroin, ecstasy, MDMA or other)                For at least one week prior to your surgery.  Combination of these drugs with anesthesia                May have life threatening results.  ____  6.  Bring all medications with you on the day of surgery if instructed.   __X__  7.  Notify your doctor if there is any change in your medical condition       (cold, fever, infections).     Do not wear jewelry, make-up, hairpins, clips or nail polish. Do not wear lotions, powders, or perfumes. Or deodorant. Do not shave 48 hours prior to surgery. Do not bring valuables to the hospital.    [facility] is not responsible for any belongings or valuables.  Contacts, dentures or bridgework may not be worn into surgery. Leave your suitcase in the car. After surgery it may be brought to your room. For patients admitted to the hospital, discharge time is determined by your treatment team.   Patients  discharged the day of surgery will not be allowed to drive home.   Make arrangements for someone to be with you for the first 24 hours of your Same Day Discharge.   __X__ Take these medicines the morning of surgery with A SIP OF WATER:    1. levothyroxine (SYNTHROID, LEVOTHROID) 75 MCG  2.   3.   4.  5.  6.  ____ Fleet Enema (as directed)   __X__ Use CHG Soap (or wipes) as directed  ____ Use Benzoyl Peroxide Gel as instructed  ____ Use inhalers on the day of surgery  ____ Stop metformin 2 days prior to surgery    ____ Take 1/2 of usual insulin dose the night before surgery. No insulin the morning          of surgery.   ____ Call your PCP, cardiologist, or Pulmonologist if taking Coumadin/Plavix/aspirin and ask when to stop before your surgery.   __X__ One Week prior to surgery- Stop Anti-inflammatories such as Ibuprofen, Aleve, Advil, Motrin, meloxicam (MOBIC), diclofenac, etodolac, ketorolac, Toradol, Daypro, piroxicam, Goody's or BC powders. OK TO USE TYLENOL IF NEEDED   __X__ Do not start any new vitamins and or supplements until after surgery.    ____ Bring C-Pap to the hospital.    If you have any questions regarding your pre-procedure instructions,  Please call Pre-admit Testing at [phone]

## 2021-08-05 ENCOUNTER — Encounter
Admission: RE | Admit: 2021-08-05 | Discharge: 2021-08-05 | Disposition: A | Discharge status: Home / Self Care | Payer: Medicare Other | Source: Ambulatory Visit | Attending: Surgery | Admitting: Surgery

## 2021-08-05 DIAGNOSIS — Z01818 Encounter for other preprocedural examination: Secondary | ICD-10-CM | POA: Insufficient documentation

## 2021-08-05 DIAGNOSIS — Z01812 Encounter for preprocedural laboratory examination: Secondary | ICD-10-CM

## 2021-08-05 DIAGNOSIS — Z01810 Encounter for preprocedural cardiovascular examination: Secondary | ICD-10-CM

## 2021-08-05 LAB — BASIC METABOLIC PANEL
Anion gap: 6 (ref 5–15)
BUN: 14 mg/dL (ref 8–23)
CO2: 29 mmol/L (ref 22–32)
Calcium: 9.6 mg/dL (ref 8.9–10.3)
Chloride: 105 mmol/L (ref 98–111)
Creatinine, Ser: 0.64 mg/dL (ref 0.44–1.00)
GFR, Estimated: >60 mL/min (ref >60)
Glucose, Bld: 102 mg/dL — ABNORMAL HIGH (ref 70–99)
Potassium: 4.4 mmol/L (ref 3.5–5.1)
Sodium: 140 mmol/L (ref 135–145)

## 2021-08-05 LAB — CBC
HCT: 40.7 % (ref 36.0–46.0)
Hemoglobin: 12.8 g/dL (ref 12.0–15.0)
MCH: 27.9 pg (ref 26.0–34.0)
MCHC: 31.4 g/dL (ref 30.0–36.0)
MCV: 88.9 fL (ref 80.0–100.0)
Platelets: 192 10*3/uL (ref 150–400)
RBC: 4.58 MIL/uL (ref 3.87–5.11)
RDW: 13.4 % (ref 11.5–15.5)
WBC: 6 10*3/uL (ref 4.0–10.5)
nRBC: 0 % (ref 0.0–0.2)

## 2021-08-08 ENCOUNTER — Encounter: Payer: Self-pay | Admitting: Surgery

## 2021-08-08 ENCOUNTER — Ambulatory Visit: Payer: Medicare Other | Admitting: Surgery

## 2021-08-08 VITALS — BP 175/96 | HR 125 | Temp 98.0°F | Wt 172.8 lb

## 2021-08-08 DIAGNOSIS — N6091 Unspecified benign mammary dysplasia of right breast: Secondary | ICD-10-CM

## 2021-08-08 NOTE — Progress Notes (Signed)
  08/08/2021  History of Present Illness: Shirley Christian is a 71 y.o. female presenting for H&P update for preparation of her right breast lumpectomy scheduled for 08/11/2021.  The patient ready had her RF tag placed on 08/02/2021 without any complications.  I have personally viewed the mammogram images obtained after tag placement.  Patient still remains somewhat hesitant about surgery but knows that is needed.  Comes in today with a series of questions.  Past Medical History: Past Medical History:  Diagnosis Date   Hypothyroidism      Past Surgical History: Past Surgical History:  Procedure Laterality Date   BREAST BIOPSY     BREAST BIOPSY Right 05/24/2021   stereo bx, asymmetry, "X" clip-ATYPICAL DUCTAL HYPERPLASIA, INVOLVING SMALL FOCUS OF SCLEROSING   CLOSED REDUCTION SHOULDER DISLOCATION     COLONOSCOPY WITH PROPOFOL N/A 03/22/2018   Procedure: COLONOSCOPY WITH PROPOFOL;  Surgeon: Jonathon Bellows, MD;  Location: [facility] ENDOSCOPY;  Service: Gastroenterology;  Laterality: N/A;    Home Medications: Prior to Admission medications   Medication Sig Start Date End Date Taking? Authorizing Provider  bimatoprost (LUMIGAN) 0.01 % SOLN 1 drop at bedtime.   Yes [provider]  levothyroxine (SYNTHROID, LEVOTHROID) 75 MCG tablet Take 75 mcg by mouth daily before breakfast.   Yes [provider]    Allergies: Allergies  Allergen Reactions   Penicillins Swelling    Review of Systems: Review of Systems  Constitutional:  Negative for chills and fever.  Respiratory:  Negative for shortness of breath.   Cardiovascular:  Negative for chest pain.  Gastrointestinal:  Negative for abdominal pain, nausea and vomiting.  Skin:  Negative for rash.    Physical Exam BP (!) 175/96   Pulse (!) 125   Temp 98 F (36.7 C) (Oral)   Wt 172 lb 12.8 oz (78.4 kg)   SpO2 97%   BMI 30.61 kg/m  CONSTITUTIONAL: No acute distress, well-nourished HEENT:  Normocephalic, atraumatic,  extraocular motion intact. RESPIRATORY:  Normal respiratory effort without pathologic use of accessory muscles. CARDIOVASCULAR: Regular rhythm and rate. BREAST: Exam deferred today NEUROLOGIC:  Motor and sensation is grossly normal.  Cranial nerves are grossly intact. PSYCH:  Alert and oriented to person, place and time. Affect is normal.  Labs/Imaging: RF tag placement on 08/02/2021: IMPRESSION: Radiofrequency tag localization of the RIGHT breast. No apparent complications.    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  - Patient is currently scheduled for surgery for right breast RF tag localized lumpectomy on 08/11/2021.  No new suspicious findings at this point that would put the surgery on hold.  Reviewed the surgery again with the patient and answered all of her questions to her satisfaction.  She reported again that she would rather do her surgery not under general anesthesia.  Potentially she could be done under MAC anesthesia but discussed with her that if there are any issues during surgery she may require general endotracheal intubation.  She is understands this.  I spent 15 minutes dedicated to the care of this patient on the date of this encounter to include pre-visit review of records, face-to-face time with the patient discussing diagnosis and management, and any post-visit coordination of care.   Melvyn Neth, [facility]

## 2021-08-08 NOTE — Patient Instructions (Signed)
If you have any concerns or questions, please feel free to call our office.   Lumpectomy, Care After This sheet gives you information about how to care for yourself after your procedure. Your health care provider may also give you more specific instructions. If you have problems or questions, contact your health care provider. What can I expect after the procedure? After the procedure, it is common to have: Breast swelling. Breast tenderness. Stiffness in your arm or shoulder. A change in the shape and feel of your breast. Scar tissue that feels hard to the touch in the area where the lump was removed. Follow these instructions at home: Medicines Take over-the-counter and prescription medicines only as told by your health care provider. If you were prescribed an antibiotic medicine, take it as told by your health care provider. Do not stop taking the antibiotic even if you start to feel better. Ask your health care provider if the medicine prescribed to you: Requires you to avoid driving or using heavy machinery. Can cause constipation. You may need to take these actions to prevent or treat constipation: Drink enough fluid to keep your urine pale yellow. Take over-the-counter or prescription medicines. Eat foods that are high in fiber, such as beans, whole grains, and fresh fruits and vegetables. Limit foods that are high in fat and processed sugars, such as fried or sweet foods. Incision care     Follow instructions from your health care provider about how to take care of your incision. Make sure you: Wash your hands with soap and water before and after you change your bandage (dressing). If soap and water are not available, use hand sanitizer. Change your dressing as told by your health care provider. Leave stitches (sutures), skin glue, or adhesive strips in place. These skin closures may need to stay in place for 2 weeks or longer. If adhesive strip edges start to loosen and curl up,  you may trim the loose edges. Do not remove adhesive strips completely unless your health care provider tells you to do that. Check your incision area every day for signs of infection. Check for: More redness, swelling, or pain. Fluid or blood. Warmth. Pus or a bad smell. Keep your dressing clean and dry. If you were sent home with a surgical drain in place, follow instructions from your health care provider about emptying it. Bathing Do not take baths, swim, or use a hot tub until your health care provider approves. Ask your health care provider if you may take showers. You may only be allowed to take sponge baths. Activity Rest as told by your health care provider. Avoid sitting for a long time without moving. Get up to take short walks every 1-2 hours. This is important to improve blood flow and breathing. Ask for help if you feel weak or unsteady. Return to your normal activities as told by your health care provider. Ask your health care provider what activities are safe for you. Be careful to avoid any activities that could cause an injury to your arm on the side of your surgery. Do not lift anything that is heavier than 10 lb (4.5 kg), or the limit that you are told, until your health care provider says that it is safe. Avoid lifting with the arm that is on the side of your surgery. Do not carry heavy objects on your shoulder on the side of your surgery. Do exercises to keep your shoulder and arm from getting stiff and swollen. Talk with your  health care provider about which exercises are safe for you. General instructions Wear a supportive bra as told by your health care provider. Raise (elevate) your arm above the level of your heart while you are sitting or lying down. Do not wear tight jewelry on your arm, wrist, or fingers on the side of your surgery. Keep all follow-up visits as told by your health care provider. This is important. You may need to be screened for extra fluid  around the lymph nodes and swelling in the breast and arm (lymphedema). Follow instructions from your health care provider about how often you should be checked. If you had any lymph nodes removed during your procedure, be sure to tell all of your health care providers. This is important information to share before you are involved in certain procedures, such as having blood tests or having your blood pressure taken. Contact a health care provider if: You develop a rash. You have a fever. Your pain medicine is not working. You have swelling, weakness, or numbness in your arm that does not improve after a few weeks. You have new swelling in your breast. You have any of these signs of infection: More redness, swelling, or pain in your incision area. Fluid or blood coming from your incision. Warmth coming from the incision area. Pus or a bad smell coming from your incision. Get help right away if you have: Very bad pain in your breast or arm. Swelling in your legs or arms. Redness, warmth, or pain in your leg or arm. Chest pain. Difficulty breathing. Summary After the procedure, it is common to have breast tenderness, swelling in your breast, and stiffness in your arm and shoulder. Follow instructions from your health care provider about how to take care of your incision. Do not lift anything that is heavier than 10 lb (4.5 kg), or the limit that you are told, until your health care provider says that it is safe. Avoid lifting with the arm that is on the side of your surgery. If you had any lymph nodes removed during your procedure, be sure to tell all of your health care providers. This is important information to share before you are involved in certain procedures, such as having blood tests or having your blood pressure taken. This information is not intended to replace advice given to you by your health care provider. Make sure you discuss any questions you have with your health care  provider. Document Revised: 08/19/2018 Document Reviewed: 08/19/2018 Elsevier Patient Education  [employer].

## 2021-08-08 NOTE — H&P (View-Only) (Signed)
  08/08/2021  History of Present Illness: Shirley Christian is a 71 y.o. female presenting for H&P update for preparation of her right breast lumpectomy scheduled for 08/11/2021.  The patient ready had her RF tag placed on 08/02/2021 without any complications.  I have personally viewed the mammogram images obtained after tag placement.  Patient still remains somewhat hesitant about surgery but knows that is needed.  Comes in today with a series of questions.  Past Medical History: Past Medical History:  Diagnosis Date   Hypothyroidism      Past Surgical History: Past Surgical History:  Procedure Laterality Date   BREAST BIOPSY     BREAST BIOPSY Right 05/24/2021   stereo bx, asymmetry, "X" clip-ATYPICAL DUCTAL HYPERPLASIA, INVOLVING SMALL FOCUS OF SCLEROSING   CLOSED REDUCTION SHOULDER DISLOCATION     COLONOSCOPY WITH PROPOFOL N/A 03/22/2018   Procedure: COLONOSCOPY WITH PROPOFOL;  Surgeon: Jonathon Bellows, MD;  Location: [facility] ENDOSCOPY;  Service: Gastroenterology;  Laterality: N/A;    Home Medications: Prior to Admission medications   Medication Sig Start Date End Date Taking? Authorizing Provider  bimatoprost (LUMIGAN) 0.01 % SOLN 1 drop at bedtime.   Yes [provider]  levothyroxine (SYNTHROID, LEVOTHROID) 75 MCG tablet Take 75 mcg by mouth daily before breakfast.   Yes [provider]    Allergies: Allergies  Allergen Reactions   Penicillins Swelling    Review of Systems: Review of Systems  Constitutional:  Negative for chills and fever.  Respiratory:  Negative for shortness of breath.   Cardiovascular:  Negative for chest pain.  Gastrointestinal:  Negative for abdominal pain, nausea and vomiting.  Skin:  Negative for rash.    Physical Exam BP (!) 175/96   Pulse (!) 125   Temp 98 F (36.7 C) (Oral)   Wt 172 lb 12.8 oz (78.4 kg)   SpO2 97%   BMI 30.61 kg/m  CONSTITUTIONAL: No acute distress, well-nourished HEENT:  Normocephalic, atraumatic,  extraocular motion intact. RESPIRATORY:  Normal respiratory effort without pathologic use of accessory muscles. CARDIOVASCULAR: Regular rhythm and rate. BREAST: Exam deferred today NEUROLOGIC:  Motor and sensation is grossly normal.  Cranial nerves are grossly intact. PSYCH:  Alert and oriented to person, place and time. Affect is normal.  Labs/Imaging: RF tag placement on 08/02/2021: IMPRESSION: Radiofrequency tag localization of the RIGHT breast. No apparent complications.    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  - Patient is currently scheduled for surgery for right breast RF tag localized lumpectomy on 08/11/2021.  No new suspicious findings at this point that would put the surgery on hold.  Reviewed the surgery again with the patient and answered all of her questions to her satisfaction.  She reported again that she would rather do her surgery not under general anesthesia.  Potentially she could be done under MAC anesthesia but discussed with her that if there are any issues during surgery she may require general endotracheal intubation.  She is understands this.  I spent 15 minutes dedicated to the care of this patient on the date of this encounter to include pre-visit review of records, face-to-face time with the patient discussing diagnosis and management, and any post-visit coordination of care.   Melvyn Neth, [facility]

## 2021-08-11 ENCOUNTER — Encounter
Admission: RE | Disposition: A | Discharge status: Home / Self Care | Payer: Self-pay | Source: Home / Self Care | Attending: Surgery

## 2021-08-11 ENCOUNTER — Ambulatory Visit: Payer: Medicare Other

## 2021-08-11 ENCOUNTER — Ambulatory Visit
Admission: RE | Admit: 2021-08-11 | Discharge: 2021-08-11 | Disposition: A | Discharge status: Home / Self Care | Payer: Medicare Other | Attending: Surgery | Admitting: Surgery

## 2021-08-11 ENCOUNTER — Ambulatory Visit: Payer: Medicare Other | Admitting: Anesthesiology

## 2021-08-11 ENCOUNTER — Ambulatory Visit
Admission: RE | Admit: 2021-08-11 | Discharge: 2021-08-11 | Disposition: A | Discharge status: Home / Self Care | Payer: Medicare Other | Source: Ambulatory Visit | Attending: Surgery | Admitting: Surgery

## 2021-08-11 ENCOUNTER — Other Ambulatory Visit: Payer: Self-pay

## 2021-08-11 ENCOUNTER — Encounter: Payer: Self-pay | Admitting: Surgery

## 2021-08-11 ENCOUNTER — Ambulatory Visit: Payer: Medicare Other | Admitting: Urgent Care

## 2021-08-11 DIAGNOSIS — N6091 Unspecified benign mammary dysplasia of right breast: Secondary | ICD-10-CM

## 2021-08-11 DIAGNOSIS — N6021 Fibroadenosis of right breast: Secondary | ICD-10-CM | POA: Insufficient documentation

## 2021-08-11 DIAGNOSIS — R928 Other abnormal and inconclusive findings on diagnostic imaging of breast: Secondary | ICD-10-CM

## 2021-08-11 HISTORY — PX: BREAST LUMPECTOMY WITH RADIOFREQUENCY TAG IDENTIFICATION: SHX6884

## 2021-08-11 SURGERY — BREAST LUMPECTOMY WITH RADIOFREQUENCY TAG IDENTIFICATION
Anesthesia: Monitor Anesthesia Care | Laterality: Right

## 2021-08-11 MED ORDER — CEFAZOLIN SODIUM-DEXTROSE 2-4 GM/100ML-% IV SOLN
2.0000 g | INTRAVENOUS | Status: AC
  Administered 2021-08-11: 2 g via INTRAVENOUS

## 2021-08-11 MED ORDER — FENTANYL CITRATE (PF) 100 MCG/2ML IJ SOLN
INTRAMUSCULAR | Status: AC
  Filled 2021-08-11: qty 2

## 2021-08-11 MED ORDER — FAMOTIDINE 20 MG PO TABS: 20.0000 mg | ORAL_TABLET | Freq: Once | ORAL | Status: AC

## 2021-08-11 MED ORDER — MIDAZOLAM HCL 2 MG/2ML IJ SOLN
INTRAMUSCULAR | Status: AC
  Filled 2021-08-11: qty 2

## 2021-08-11 MED ORDER — MIDAZOLAM HCL 2 MG/2ML IJ SOLN
INTRAMUSCULAR | Status: AC
  Administered 2021-08-11: 1 mg via INTRAVENOUS
  Filled 2021-08-11: qty 2

## 2021-08-11 MED ORDER — PROPOFOL 10 MG/ML IV BOLUS
INTRAVENOUS | Status: AC
  Filled 2021-08-11: qty 20

## 2021-08-11 MED ORDER — ACETAMINOPHEN 10 MG/ML IV SOLN: 1000.0000 mg | Freq: Once | INTRAVENOUS | Status: DC | PRN

## 2021-08-11 MED ORDER — CHLORHEXIDINE GLUCONATE 0.12 % MT SOLN: 15.0000 mL | Freq: Once | OROMUCOSAL | Status: AC

## 2021-08-11 MED ORDER — FAMOTIDINE 20 MG PO TABS
ORAL_TABLET | ORAL | Status: AC
  Administered 2021-08-11: 20 mg via ORAL
  Filled 2021-08-11: qty 1

## 2021-08-11 MED ORDER — ORAL CARE MOUTH RINSE: 15.0000 mL | Freq: Once | OROMUCOSAL | Status: AC

## 2021-08-11 MED ORDER — ACETAMINOPHEN 500 MG PO TABS: 1000.0000 mg | ORAL_TABLET | Freq: Four times a day (QID) | ORAL | Status: DC | PRN

## 2021-08-11 MED ORDER — PROPOFOL 500 MG/50ML IV EMUL
INTRAVENOUS | Status: DC | PRN
  Administered 2021-08-11: 20 mg via INTRAVENOUS

## 2021-08-11 MED ORDER — PROPOFOL 1000 MG/100ML IV EMUL
INTRAVENOUS | Status: AC
  Filled 2021-08-11: qty 100

## 2021-08-11 MED ORDER — BUPIVACAINE HCL (PF) 0.5 % IJ SOLN
INTRAMUSCULAR | Status: DC | PRN
  Administered 2021-08-11: 75 mg via PERINEURAL

## 2021-08-11 MED ORDER — MIDAZOLAM HCL 2 MG/2ML IJ SOLN
1.0000 mg | INTRAMUSCULAR | Status: AC | PRN
  Administered 2021-08-11: 1 mg via INTRAVENOUS

## 2021-08-11 MED ORDER — ONDANSETRON HCL 4 MG/2ML IJ SOLN
INTRAMUSCULAR | Status: AC
  Filled 2021-08-11: qty 2

## 2021-08-11 MED ORDER — LACTATED RINGERS IV SOLN: INTRAVENOUS | Status: DC

## 2021-08-11 MED ORDER — OXYCODONE HCL 5 MG/5ML PO SOLN: 5.0000 mg | Freq: Once | ORAL | Status: AC | PRN

## 2021-08-11 MED ORDER — MIDAZOLAM HCL 2 MG/2ML IJ SOLN
INTRAMUSCULAR | Status: DC | PRN
  Administered 2021-08-11: 1 mg via INTRAVENOUS

## 2021-08-11 MED ORDER — ACETAMINOPHEN 500 MG PO TABS
ORAL_TABLET | ORAL | Status: AC
  Administered 2021-08-11: 1000 mg via ORAL
  Filled 2021-08-11: qty 2

## 2021-08-11 MED ORDER — CHLORHEXIDINE GLUCONATE CLOTH 2 % EX PADS: 6.0000 | MEDICATED_PAD | Freq: Once | CUTANEOUS | Status: DC

## 2021-08-11 MED ORDER — BUPIVACAINE-EPINEPHRINE 0.5% -1:200000 IJ SOLN
INTRAMUSCULAR | Status: DC | PRN
  Administered 2021-08-11: 30 mL

## 2021-08-11 MED ORDER — PROMETHAZINE HCL 25 MG/ML IJ SOLN: 6.2500 mg | INTRAMUSCULAR | Status: DC | PRN

## 2021-08-11 MED ORDER — ACETAMINOPHEN 500 MG PO TABS: 1000.0000 mg | ORAL_TABLET | ORAL | Status: AC

## 2021-08-11 MED ORDER — PHENYLEPHRINE HCL (PRESSORS) 10 MG/ML IV SOLN
INTRAVENOUS | Status: DC | PRN
  Administered 2021-08-11: 80 ug via INTRAVENOUS
  Administered 2021-08-11: 160 ug via INTRAVENOUS
  Administered 2021-08-11 (×3): 80 ug via INTRAVENOUS
  Administered 2021-08-11 (×3): 160 ug via INTRAVENOUS

## 2021-08-11 MED ORDER — GABAPENTIN 300 MG PO CAPS
ORAL_CAPSULE | ORAL | Status: AC
  Administered 2021-08-11: 200 mg via ORAL
  Filled 2021-08-11: qty 1

## 2021-08-11 MED ORDER — PHENYLEPHRINE 80 MCG/ML (10ML) SYRINGE FOR IV PUSH (FOR BLOOD PRESSURE SUPPORT)
PREFILLED_SYRINGE | INTRAVENOUS | Status: AC
  Filled 2021-08-11: qty 10

## 2021-08-11 MED ORDER — DEXMEDETOMIDINE (PRECEDEX) IN NS 20 MCG/5ML (4 MCG/ML) IV SYRINGE
PREFILLED_SYRINGE | INTRAVENOUS | Status: DC | PRN
  Administered 2021-08-11: 8 ug via INTRAVENOUS

## 2021-08-11 MED ORDER — OXYCODONE HCL 5 MG PO TABS
5.0000 mg | ORAL_TABLET | Freq: Once | ORAL | Status: AC | PRN
  Administered 2021-08-11: 5 mg via ORAL

## 2021-08-11 MED ORDER — OXYCODONE HCL 5 MG PO TABS: 5.0000 mg | ORAL_TABLET | ORAL | 0 refills | Status: DC | PRN

## 2021-08-11 MED ORDER — LIDOCAINE HCL (PF) 1 % IJ SOLN
INTRAMUSCULAR | Status: AC
  Filled 2021-08-11: qty 5

## 2021-08-11 MED ORDER — LIDOCAINE HCL (PF) 1 % IJ SOLN
INTRAMUSCULAR | Status: DC | PRN
  Administered 2021-08-11: 1 mL via SUBCUTANEOUS

## 2021-08-11 MED ORDER — FENTANYL CITRATE (PF) 100 MCG/2ML IJ SOLN: 25.0000 ug | INTRAMUSCULAR | Status: DC | PRN

## 2021-08-11 MED ORDER — ONDANSETRON HCL 4 MG/2ML IJ SOLN
INTRAMUSCULAR | Status: DC | PRN
  Administered 2021-08-11: 4 mg via INTRAVENOUS

## 2021-08-11 MED ORDER — OXYCODONE HCL 5 MG PO TABS
ORAL_TABLET | ORAL | Status: AC
  Filled 2021-08-11: qty 1

## 2021-08-11 MED ORDER — EPHEDRINE SULFATE (PRESSORS) 50 MG/ML IJ SOLN
INTRAMUSCULAR | Status: DC | PRN
  Administered 2021-08-11: 10 mg via INTRAVENOUS

## 2021-08-11 MED ORDER — STERILE WATER FOR IRRIGATION IR SOLN
Status: DC | PRN
  Administered 2021-08-11: 150 mL

## 2021-08-11 MED ORDER — CHLORHEXIDINE GLUCONATE 0.12 % MT SOLN
OROMUCOSAL | Status: AC
  Administered 2021-08-11: 15 mL via OROMUCOSAL
  Filled 2021-08-11: qty 15

## 2021-08-11 MED ORDER — GABAPENTIN 100 MG PO CAPS: 200.0000 mg | ORAL_CAPSULE | ORAL | Status: AC

## 2021-08-11 MED ORDER — BUPIVACAINE-EPINEPHRINE (PF) 0.5% -1:200000 IJ SOLN
INTRAMUSCULAR | Status: AC
  Filled 2021-08-11: qty 30

## 2021-08-11 MED ORDER — DROPERIDOL 2.5 MG/ML IJ SOLN: 0.6250 mg | Freq: Once | INTRAMUSCULAR | Status: DC | PRN

## 2021-08-11 MED ORDER — CEFAZOLIN SODIUM-DEXTROSE 2-4 GM/100ML-% IV SOLN
INTRAVENOUS | Status: AC
  Filled 2021-08-11: qty 100

## 2021-08-11 MED ORDER — GABAPENTIN 100 MG PO CAPS
ORAL_CAPSULE | ORAL | Status: DC
Start: 2021-08-11 — End: 2021-08-11
  Filled 2021-08-11: qty 2

## 2021-08-11 MED ORDER — BUPIVACAINE HCL (PF) 0.5 % IJ SOLN
INTRAMUSCULAR | Status: AC
  Filled 2021-08-11: qty 20

## 2021-08-11 MED ORDER — FENTANYL CITRATE (PF) 100 MCG/2ML IJ SOLN
INTRAMUSCULAR | Status: DC | PRN
Start: 2021-08-11 — End: 2021-08-11
  Administered 2021-08-11: 50 ug via INTRAVENOUS
  Administered 2021-08-11 (×2): 25 ug via INTRAVENOUS

## 2021-08-11 MED ORDER — IBUPROFEN 600 MG PO TABS: 600.0000 mg | ORAL_TABLET | Freq: Three times a day (TID) | ORAL | 1 refills | Status: DC | PRN

## 2021-08-11 SURGICAL SUPPLY — 42 items
ADH SKN CLS APL DERMABOND .7 (GAUZE/BANDAGES/DRESSINGS) ×1
APL PRP STRL LF DISP 70% ISPRP (MISCELLANEOUS) ×2
BINDER BREAST LRG (GAUZE/BANDAGES/DRESSINGS) IMPLANT
BINDER BREAST MEDIUM (GAUZE/BANDAGES/DRESSINGS) IMPLANT
BLADE PHOTON ILLUMINATED (MISCELLANEOUS) ×1 IMPLANT
BLADE SURG 15 STRL LF DISP TIS (BLADE) ×2 IMPLANT
BLADE SURG 15 STRL SS (BLADE) ×4
CHLORAPREP W/TINT 26 (MISCELLANEOUS) ×3 IMPLANT
COVER PROBE FLX POLY STRL (MISCELLANEOUS) ×2 IMPLANT
DERMABOND ADVANCED (GAUZE/BANDAGES/DRESSINGS) ×1
DERMABOND ADVANCED .7 DNX12 (GAUZE/BANDAGES/DRESSINGS) ×1 IMPLANT
DEVICE DUBIN SPECIMEN MAMMOGRA (MISCELLANEOUS) ×2 IMPLANT
DRAPE LAPAROTOMY 100X77 ABD (DRAPES) ×2 IMPLANT
DRSG GAUZE FLUFF 36X18 (GAUZE/BANDAGES/DRESSINGS) ×2 IMPLANT
ELECT CAUTERY BLADE TIP 2.5 (TIP) ×2
ELECT REM PT RETURN 9FT ADLT (ELECTROSURGICAL) ×2
ELECTRODE CAUTERY BLDE TIP 2.5 (TIP) ×1 IMPLANT
ELECTRODE REM PT RTRN 9FT ADLT (ELECTROSURGICAL) ×1 IMPLANT
GLOVE SURG SYN 7.0 (GLOVE) ×2 IMPLANT
GLOVE SURG SYN 7.0 PF PI (GLOVE) ×1 IMPLANT
GLOVE SURG SYN 7.5  E (GLOVE) ×2
GLOVE SURG SYN 7.5 E (GLOVE) ×1 IMPLANT
GLOVE SURG SYN 7.5 PF PI (GLOVE) ×1 IMPLANT
GOWN STRL REUS W/ TWL LRG LVL3 (GOWN DISPOSABLE) ×2 IMPLANT
GOWN STRL REUS W/TWL LRG LVL3 (GOWN DISPOSABLE) ×8
KIT MARKER MARGIN INK (KITS) ×1 IMPLANT
KIT TURNOVER KIT A (KITS) ×2 IMPLANT
LABEL OR SOLS (LABEL) ×2 IMPLANT
MANIFOLD NEPTUNE II (INSTRUMENTS) ×2 IMPLANT
NDL HYPO 25X1 1.5 SAFETY (NEEDLE) ×1 IMPLANT
NEEDLE HYPO 22GX1.5 SAFETY (NEEDLE) ×2 IMPLANT
NEEDLE HYPO 25X1 1.5 SAFETY (NEEDLE) ×2 IMPLANT
PACK BASIN MINOR ARMC (MISCELLANEOUS) ×2 IMPLANT
SET LOCALIZER 20 PROBE US (MISCELLANEOUS) ×2 IMPLANT
SUT MNCRL 4-0 (SUTURE) ×2
SUT MNCRL 4-0 27XMFL (SUTURE) ×1
SUT VIC AB 3-0 SH 27 (SUTURE) ×2
SUT VIC AB 3-0 SH 27X BRD (SUTURE) ×1 IMPLANT
SUTURE MNCRL 4-0 27XMF (SUTURE) ×1 IMPLANT
SYR 10ML LL (SYRINGE) ×2 IMPLANT
TRAP NEPTUNE SPECIMEN COLLECT (MISCELLANEOUS) ×2 IMPLANT
WATER STERILE IRR 1000ML POUR (IV SOLUTION) ×2 IMPLANT

## 2021-08-11 NOTE — Anesthesia Procedure Notes (Signed)
Anesthesia Regional Block: Pectoralis block (PECS II)   Pre-Anesthetic Checklist: , timeout performed,  Correct Patient, Correct Site, Correct Laterality,  Correct Procedure, Correct Position, site marked,  Risks and benefits discussed,  Surgical consent,  Pre-op evaluation,  At surgeon's request and post-op pain management  Laterality: Right  Prep: chloraprep       Needles:  Injection technique: Single-shot  Needle Type: Stimiplex     Needle Length: 9cm  Needle Gauge: 22     Additional Needles:   Procedures:,,,, ultrasound used (permanent image in chart),,    Narrative:  Start time: 08/11/2021 12:10 PM End time: 08/11/2021 12:12 PM Injection made incrementally with aspirations every 30 mL.  Performed by: Personally  Anesthesiologist: Iran Ouch, MD  Additional Notes: Patient consented for risk and benefits of nerve block including but not limited to nerve damage, failed block, bleeding and infection.  Patient voiced understanding.  Functioning IV was confirmed and monitors were applied.  Timeout done prior to procedure and prior to any sedation being given to the patient.  Patient confirmed procedure site prior to any sedation given to the patient.  A 72m 22ga Stimuplex needle was used. Sterile prep,hand hygiene and sterile gloves were used.  Minimal sedation used for procedure.  No paresthesia endorsed by patient during the procedure.  Negative aspiration and negative test dose prior to incremental administration of local anesthetic. The patient tolerated the procedure well with no immediate complications.

## 2021-08-11 NOTE — Anesthesia Procedure Notes (Signed)
Date/Time: 08/11/2021 12:30 PM  Performed by: Lerry Liner, CRNAOxygen Delivery Method: Simple face mask

## 2021-08-11 NOTE — Anesthesia Postprocedure Evaluation (Signed)
Anesthesia Post Note  Patient: Shirley Christian  Procedure(s) Performed: BREAST LUMPECTOMY WITH RADIOFREQUENCY TAG IDENTIFICATION (Right)  Patient location during evaluation: PACU Anesthesia Type: Regional Level of consciousness: awake and alert Pain management: pain level controlled Vital Signs Assessment: post-procedure vital signs reviewed and stable Respiratory status: spontaneous breathing, nonlabored ventilation and respiratory function stable Cardiovascular status: blood pressure returned to baseline and stable Postop Assessment: no apparent nausea or vomiting Anesthetic complications: no   No notable events documented.   Last Vitals:  Vitals:   08/11/21 1445 08/11/21 1501  BP: (!) 156/84 (!) 152/75  Pulse: 94 90  Resp: 18 16  Temp: (!) 36.2 C (!) 36.1 C  SpO2: 97% 98%    Last Pain:  Vitals:   08/11/21 1501  TempSrc: Temporal  PainSc:                  Iran Ouch

## 2021-08-11 NOTE — Discharge Instructions (Signed)

## 2021-08-11 NOTE — Transfer of Care (Signed)
Immediate Anesthesia Transfer of Care Note  Patient: Shirley Christian  Procedure(s) Performed: BREAST LUMPECTOMY WITH RADIOFREQUENCY TAG IDENTIFICATION (Right)  Patient Location: PACU  Anesthesia Type:General  Level of Consciousness: awake  Airway & Oxygen Therapy: Patient Spontanous Breathing and Patient connected to nasal cannula oxygen  Post-op Assessment: Report given to RN  Post vital signs: stable  Last Vitals:  Vitals Value Taken Time  BP    Temp    Pulse    Resp    SpO2      Last Pain:  Vitals:   08/11/21 1108  TempSrc: Oral  PainSc: 0-No pain         Complications: No notable events documented.

## 2021-08-11 NOTE — Op Note (Signed)
  Procedure Date:  08/11/2021  Pre-operative Diagnosis:  Right breast ADH  Post-operative Diagnosis: Right breast ADH  Procedure:  Right breast RF tag-localized lumpectomy  Surgeon:  Melvyn Neth, MD  Assistant:  Darnelle Bos, PA-S  Anesthesia:  General endotracheal  Estimated Blood Loss:  10 ml  Specimens:  Right breast mass  Complications:  None  Indications for Procedure:  This is a 71 y.o. female who presents with with diagnosis of ADH on the right breast.  She's presenting for excision.  The risks of bleeding, infection, injury to surrounding structures, hematoma, seroma, open wound, cosmetic deformity, and the need for further surgery were all discussed with the patient and was willing to proceed.  Prior to this procedure, the patient had undergone RF tag localization.  Description of Procedure: The patient was correctly identified in the preoperative area and brought into the operating room.  The patient was placed supine with VTE prophylaxis in place.  Appropriate time-outs were performed.  Anesthesia was induced and the patient was intubated.  Appropriate antibiotics were infused.  The right chest was prepped and draped in usual sterile fashion.  The RF tag localization site was determined using the Hologic probe with lateral approach.  An incision was made overlying the tag and clip.  Hologic probe was used to guide our dissection using electrocautery, and a partial mastectomy was performed with adequate margins.  MarginMarker was used to ink each of the sides of the specimen.  The specimen was then imaged to confirm that the area of concern, biopsy clip, and RF tag were included in the excision.  This was then sent to pathology.  The cavity was irrigated and hemostasis was assured with electrocautery.  Local anesthetic was infiltrated into the skin and subcutaneous tissue of the cavity.  The wound was then closed in three layers with 3-0 Vicryl and 4-0 Monocryl and sealed with  DermaBond.  The patient was emerged from anesthesia and extubated and brought to the recovery room for further management.  The patient tolerated the procedure well and all counts were correct at the end of the case.   Melvyn Neth, MD

## 2021-08-11 NOTE — Interval H&P Note (Signed)
History and Physical Interval Note:  08/11/2021 12:17 PM  Shirley Christian  has presented today for surgery, with the diagnosis of right breast atypical ductal hyperplasia.  The various methods of treatment have been discussed with the patient and family. After consideration of risks, benefits and other options for treatment, the patient has consented to  Procedure(s): BREAST LUMPECTOMY WITH RADIOFREQUENCY TAG IDENTIFICATION (Right) as a surgical intervention.  The patient's history has been reviewed, patient examined, no change in status, stable for surgery.  I have reviewed the patient's chart and labs.  Questions were answered to the patient's satisfaction.     [name]

## 2021-08-11 NOTE — Anesthesia Preprocedure Evaluation (Addendum)
Anesthesia Evaluation  Patient identified by MRN, date of birth, ID band Patient awake    Reviewed: Allergy & Precautions, NPO status , Patient's Chart, lab work & pertinent test results  History of Anesthesia Complications Negative for: history of anesthetic complications  Airway Mallampati: II  TM Distance: >3 FB Neck ROM: Full    Dental no notable dental hx.    Pulmonary neg pulmonary ROS, neg sleep apnea, neg COPD,    breath sounds clear to auscultation- rhonchi (-) wheezing      Cardiovascular Exercise Tolerance: Good (-) hypertension(-) CAD and (-) Past MI  Rhythm:Regular Rate:Normal - Systolic murmurs and - Diastolic murmurs    Neuro/Psych negative neurological ROS  negative psych ROS   GI/Hepatic negative GI ROS, Neg liver ROS,   Endo/Other  neg diabetesHypothyroidism   Renal/GU negative Renal ROS     Musculoskeletal negative musculoskeletal ROS (+)   Abdominal (+) - obese,   Peds  Hematology negative hematology ROS (+)   Anesthesia Other Findings   Reproductive/Obstetrics                             Anesthesia Physical  Anesthesia Plan  ASA: II  Anesthesia Plan: MAC and Regional   Post-op Pain Management: Regional block*, Celebrex PO (pre-op)*, Tylenol PO (pre-op)* and Gabapentin PO (pre-op)*   Induction: Intravenous  PONV Risk Score and Plan: 2 and Propofol infusion  Airway Management Planned: Natural Airway  Additional Equipment:   Intra-op Plan:   Post-operative Plan:   Informed Consent: I have reviewed the patients History and Physical, chart, labs and discussed the procedure including the risks, benefits and alternatives for the proposed anesthesia with the patient or authorized representative who has indicated his/her understanding and acceptance.     Dental advisory given  Plan Discussed with: CRNA and Anesthesiologist  Anesthesia Plan Comments:         Anesthesia Quick Evaluation

## 2021-08-17 LAB — SURGICAL PATHOLOGY

## 2021-08-17 NOTE — Progress Notes (Signed)
08/17/21  Called patient to discuss pathology results, showing ADH with sclerosing adenosis, with clear though close margin, and no finding of DCIS or cancer.  Patient reports she's healing well.  She has an appointment with me on 6/30 and will set up Oncology referral for endocrine therapy consideration given the ADH.  Shirley Ree, MD

## 2021-08-26 ENCOUNTER — Ambulatory Visit (INDEPENDENT_AMBULATORY_CARE_PROVIDER_SITE_OTHER): Payer: Medicare Other | Admitting: Surgery

## 2021-08-26 ENCOUNTER — Encounter: Payer: Self-pay | Admitting: Surgery

## 2021-08-26 VITALS — BP 153/77 | HR 86 | Temp 98.2°F | Ht 64.0 in | Wt 175.0 lb

## 2021-08-26 DIAGNOSIS — N6091 Unspecified benign mammary dysplasia of right breast: Secondary | ICD-10-CM

## 2021-08-26 DIAGNOSIS — Z09 Encounter for follow-up examination after completed treatment for conditions other than malignant neoplasm: Secondary | ICD-10-CM

## 2021-08-26 NOTE — Progress Notes (Signed)
08/26/2021  HPI: Shirley Christian is a 71 y.o. female s/p right breast lumpectomy for atypical ductal hyperplasia on 08/11/2021.  Patient presents today for follow-up.  Final pathology confirms atypical ductal hyperplasia with no evidence of malignancy.  Patient reports she has been doing very well and denies any significant pain or swelling of the breast.  There is some bruising which is resolving.  Vital signs: BP (!) 153/77   Pulse 86   Temp 98.2 F (36.8 C)   Ht '5\' 4"'$  (1.626 m)   Wt 175 lb (79.4 kg)   SpO2 99%   BMI 30.04 kg/m    Physical Exam: Constitutional: No acute distress Breast: Right breast lateral incision is healing well and is clean, dry, intact.  No evidence of any significant seroma.  Mild ecchymosis which is resolving.  Assessment/Plan: This is a 71 y.o. female s/p right breast lumpectomy.  - Discussed pathology results again with the patient showing ADH with no malignancy.  Patient is healing well without any issues at this point. - Patient can start transitioning now from her breast binder to a sports bra for the next 2 weeks and then transition to her regular bra. - Patient needs a left breast diagnostic mammogram in 3 months to comply with a 80-monthfollow-up recommendation from her mammogram back in March 2023.  We will follow-up with the patient after the mammogram to discuss the results. - We will also send a referral to oncology for consideration of endocrine therapy given her ADH.   JMelvyn Neth [facility]

## 2021-08-26 NOTE — Patient Instructions (Addendum)
We will get you scheduled for a left breast mammogram and follow up in March.  We will send you a letter about these appointments.   Lumpectomy, Care After This sheet gives you information about how to care for yourself after your procedure. Your health care provider may also give you more specific instructions. If you have problems or questions, contact your health care provider. What can I expect after the procedure? After the procedure, it is common to have: Breast swelling. Breast tenderness. Stiffness in your arm or shoulder. A change in the shape and feel of your breast. Scar tissue that feels hard to the touch in the area where the lump was removed. Follow these instructions at home: Medicines Take over-the-counter and prescription medicines only as told by your health care provider. If you were prescribed an antibiotic medicine, take it as told by your health care provider. Do not stop taking the antibiotic even if you start to feel better. Ask your health care provider if the medicine prescribed to you: Requires you to avoid driving or using heavy machinery. Can cause constipation. You may need to take these actions to prevent or treat constipation: Drink enough fluid to keep your urine pale yellow. Take over-the-counter or prescription medicines. Eat foods that are high in fiber, such as beans, whole grains, and fresh fruits and vegetables. Limit foods that are high in fat and processed sugars, such as fried or sweet foods. Incision care     Follow instructions from your health care provider about how to take care of your incision. Make sure you: Wash your hands with soap and water before and after you change your bandage (dressing). If soap and water are not available, use hand sanitizer. Change your dressing as told by your health care provider. Leave stitches (sutures), skin glue, or adhesive strips in place. These skin closures may need to stay in place for 2 weeks or  longer. If adhesive strip edges start to loosen and curl up, you may trim the loose edges. Do not remove adhesive strips completely unless your health care provider tells you to do that. Check your incision area every day for signs of infection. Check for: More redness, swelling, or pain. Fluid or blood. Warmth. Pus or a bad smell. Keep your dressing clean and dry. If you were sent home with a surgical drain in place, follow instructions from your health care provider about emptying it. Bathing Do not take baths, swim, or use a hot tub until your health care provider approves. Ask your health care provider if you may take showers. You may only be allowed to take sponge baths. Activity Rest as told by your health care provider. Avoid sitting for a long time without moving. Get up to take short walks every 1-2 hours. This is important to improve blood flow and breathing. Ask for help if you feel weak or unsteady. Return to your normal activities as told by your health care provider. Ask your health care provider what activities are safe for you. Be careful to avoid any activities that could cause an injury to your arm on the side of your surgery. Do not lift anything that is heavier than 10 lb (4.5 kg), or the limit that you are told, until your health care provider says that it is safe. Avoid lifting with the arm that is on the side of your surgery. Do not carry heavy objects on your shoulder on the side of your surgery. Do exercises to keep your  shoulder and arm from getting stiff and swollen. Talk with your health care provider about which exercises are safe for you. General instructions Wear a supportive bra as told by your health care provider. Raise (elevate) your arm above the level of your heart while you are sitting or lying down. Do not wear tight jewelry on your arm, wrist, or fingers on the side of your surgery. Keep all follow-up visits as told by your health care provider. This is  important. You may need to be screened for extra fluid around the lymph nodes and swelling in the breast and arm (lymphedema). Follow instructions from your health care provider about how often you should be checked. If you had any lymph nodes removed during your procedure, be sure to tell all of your health care providers. This is important information to share before you are involved in certain procedures, such as having blood tests or having your blood pressure taken. Contact a health care provider if: You develop a rash. You have a fever. Your pain medicine is not working. You have swelling, weakness, or numbness in your arm that does not improve after a few weeks. You have new swelling in your breast. You have any of these signs of infection: More redness, swelling, or pain in your incision area. Fluid or blood coming from your incision. Warmth coming from the incision area. Pus or a bad smell coming from your incision. Get help right away if you have: Very bad pain in your breast or arm. Swelling in your legs or arms. Redness, warmth, or pain in your leg or arm. Chest pain. Difficulty breathing. Summary After the procedure, it is common to have breast tenderness, swelling in your breast, and stiffness in your arm and shoulder. Follow instructions from your health care provider about how to take care of your incision. Do not lift anything that is heavier than 10 lb (4.5 kg), or the limit that you are told, until your health care provider says that it is safe. Avoid lifting with the arm that is on the side of your surgery. If you had any lymph nodes removed during your procedure, be sure to tell all of your health care providers. This is important information to share before you are involved in certain procedures, such as having blood tests or having your blood pressure taken. This information is not intended to replace advice given to you by your health care provider. Make sure you  discuss any questions you have with your health care provider. Document Revised: 08/19/2018 Document Reviewed: 08/19/2018 Elsevier Patient Education  [place].

## 2021-08-31 ENCOUNTER — Telehealth: Payer: Self-pay

## 2021-08-31 DIAGNOSIS — N6091 Unspecified benign mammary dysplasia of right breast: Secondary | ICD-10-CM

## 2021-08-31 NOTE — Telephone Encounter (Signed)
Spoke with patient-let her know referral was placed to Oncology and someone from their office should call to schedule an appointment within a few days- I let her know she can call their office as well and schedule the appointment if she did not want to wait.

## 2021-09-27 ENCOUNTER — Other Ambulatory Visit: Payer: Self-pay

## 2021-09-27 DIAGNOSIS — N6489 Other specified disorders of breast: Secondary | ICD-10-CM

## 2021-10-21 ENCOUNTER — Inpatient Hospital Stay: Payer: Medicare Other

## 2021-10-21 ENCOUNTER — Inpatient Hospital Stay: Payer: Medicare Other | Attending: Oncology | Admitting: Oncology

## 2021-10-21 ENCOUNTER — Encounter: Payer: Self-pay | Admitting: Oncology

## 2021-10-21 VITALS — BP 163/82 | HR 104 | Resp 18 | Ht 64.0 in | Wt 179.0 lb

## 2021-10-21 DIAGNOSIS — N6091 Unspecified benign mammary dysplasia of right breast: Secondary | ICD-10-CM

## 2021-10-21 DIAGNOSIS — N6099 Unspecified benign mammary dysplasia of unspecified breast: Secondary | ICD-10-CM

## 2021-10-21 DIAGNOSIS — Z79890 Hormone replacement therapy (postmenopausal): Secondary | ICD-10-CM | POA: Insufficient documentation

## 2021-10-21 DIAGNOSIS — E039 Hypothyroidism, unspecified: Secondary | ICD-10-CM | POA: Insufficient documentation

## 2021-10-21 DIAGNOSIS — Z7189 Other specified counseling: Secondary | ICD-10-CM

## 2021-10-31 NOTE — Progress Notes (Signed)
Hematology/Oncology Consult note [facility] Telephone:([phone] [phone]  Patient Care Team: Ranae Plumber, [place] as PCP - General (Family Medicine)   Name of the patient: Shirley Christian  [id number]  11-Jun-1950    Reason for referral-atypical ductal hyperplasia   Referring physician-Dr. Hampton Abbot  Date of visit: 10/31/21   History of presenting illness- Patient is a 71 year old female who underwent a screening mammogram in February 2023 which showed possible asymmetry in bilateral breasts.  This was followed by diagnostic mammogram and ultrasound which recommended biopsy of the right breast asymmetry.  This was followed by biopsy which showed atypical ductal hyperplasia involving small focus of sclerosing adenosis.  He underwent lumpectomy with Dr. Hampton Abbot which showed atypical ductal hyperplasia with fibroadenoma and adjacent sclerosing adenosis.  No evidence of in situ or invasive carcinoma.  Patient has been referred for further management  Patient is currently doing well and denies any specific complaints at thisTime  ECOG PS- 1  Pain scale- 0   Review of systems- Review of Systems  Constitutional:  Negative for chills, fever, malaise/fatigue and weight loss.  HENT:  Negative for congestion, ear discharge and nosebleeds.   Eyes:  Negative for blurred vision.  Respiratory:  Negative for cough, hemoptysis, sputum production, shortness of breath and wheezing.   Cardiovascular:  Negative for chest pain, palpitations, orthopnea and claudication.  Gastrointestinal:  Negative for abdominal pain, blood in stool, constipation, diarrhea, heartburn, melena, nausea and vomiting.  Genitourinary:  Negative for dysuria, flank pain, frequency, hematuria and urgency.  Musculoskeletal:  Negative for back pain, joint pain and myalgias.  Skin:  Negative for rash.  Neurological:  Negative for dizziness, tingling, focal weakness, seizures, weakness and headaches.   Endo/Heme/Allergies:  Does not bruise/bleed easily.  Psychiatric/Behavioral:  Negative for depression and suicidal ideas. The patient does not have insomnia.     Allergies  Allergen Reactions   Penicillins Swelling    Patient Active Problem List   Diagnosis Date Noted   Atypical ductal hyperplasia of right breast      Past Medical History:  Diagnosis Date   Hypothyroidism      Past Surgical History:  Procedure Laterality Date   BREAST BIOPSY     BREAST BIOPSY Right 05/24/2021   stereo bx, asymmetry, "X" clip-ATYPICAL DUCTAL HYPERPLASIA, INVOLVING SMALL FOCUS OF SCLEROSING   BREAST LUMPECTOMY WITH RADIOFREQUENCY TAG IDENTIFICATION Right 08/11/2021   Procedure: BREAST LUMPECTOMY WITH RADIOFREQUENCY TAG IDENTIFICATION;  Surgeon: Olean Ree, MD;  Location: [facility] ORS;  Service: General;  Laterality: Right;   CLOSED REDUCTION SHOULDER DISLOCATION     COLONOSCOPY WITH PROPOFOL N/A 03/22/2018   Procedure: COLONOSCOPY WITH PROPOFOL;  Surgeon: Jonathon Bellows, MD;  Location: [facility] ENDOSCOPY;  Service: Gastroenterology;  Laterality: N/A;    Social History   Socioeconomic History   Marital status: Divorced    Spouse name: Not on file   Number of children: Not on file   Years of education: Not on file   Highest education level: Not on file  Occupational History   Not on file  Tobacco Use   Smoking status: Never   Smokeless tobacco: Never  Vaping Use   Vaping Use: Never used  Substance and Sexual Activity   Alcohol use: Never   Drug use: Never   Sexual activity: Not on file  Other Topics Concern   Not on file  Social History Narrative   Not on file   Social Determinants of Health   Financial Resource Strain: Not on file  Food Insecurity: Not on file  Transportation Needs: Not on file  Physical Activity: Not on file  Stress: Not on file  Social Connections: Not on file  Intimate Partner Violence: Not on file     Family History  Problem Relation Age of Onset    Breast cancer Neg Hx      Current Outpatient Medications:    bimatoprost (LUMIGAN) 0.01 % SOLN, , Disp: , Rfl:    levothyroxine (SYNTHROID) 88 MCG tablet, Take 88 mcg by mouth daily., Disp: , Rfl:    Physical exam:  Vitals:   10/21/21 1434 10/21/21 1438  BP: (!) 174/85 (!) 163/82  Pulse: (!) 112 (!) 104  Resp: 18   SpO2: 100%   Weight: 179 lb (81.2 kg)   Height: '5\' 4"'$  (1.626 m)    Physical Exam Constitutional:      General: She is not in acute distress. Cardiovascular:     Rate and Rhythm: Normal rate and regular rhythm.     Heart sounds: Normal heart sounds.  Pulmonary:     Effort: Pulmonary effort is normal.     Breath sounds: Normal breath sounds.  Skin:    General: Skin is warm and dry.  Neurological:     Mental Status: She is alert and oriented to person, place, and time.           Latest Ref Rng & Units 08/05/2021    8:32 AM  CMP  Glucose 70 - 99 mg/dL 102   BUN 8 - 23 mg/dL 14   Creatinine 0.44 - 1.00 mg/dL 0.64   Sodium 135 - 145 mmol/L 140   Potassium 3.5 - 5.1 mmol/L 4.4   Chloride 98 - 111 mmol/L 105   CO2 22 - 32 mmol/L 29   Calcium 8.9 - 10.3 mg/dL 9.6       Latest Ref Rng & Units 08/05/2021    8:32 AM  CBC  WBC 4.0 - 10.5 K/uL 6.0   Hemoglobin 12.0 - 15.0 g/dL 12.8   Hematocrit 36.0 - 46.0 % 40.7   Platelets 150 - 400 K/uL 192    Assessment and plan- Patient is a 71 y.o. female referred for atypical ductal hyperplasia  Discussed with the patient that atypical ductal hyperplasia is not considered as a malignant condition and therefore does not require any radiation treatment or chemotherapy.  Atypical ductal hyperplasia increases the risk of breast cancer by 3-5 fold as compared to general population.   Chemoprevention with endocrine therapy for 5 years is reasonable consideration at this time.   Discussed that we have 3 options moving forward   Tamoxifen for 5 years.  This has been compared head-to-head as per start trial to raloxifene and  was found to be better in terms of reducing the risk of invasive breast cancer roughly [phone] women for 5 years.  However tamoxifen is associated with increased risk of DVT, cataracts as well as endometrial cancer.  He does have a favorable effect on bone profile. 2.  Raloxifene: Somewhat inferior to tamoxifen in terms of reducing breast cancer risk.  It does have a favorable effect on the bone profile and is not associated with increased risk of endometrial cancer.   3.  Aromatase inhibitors have not been compared head-to-head with tamoxifen or raloxifene.  However these drugs have a better reduction in breast cancer recurrence when used in postmenopausal women in adjuvant setting with invasive breast cancer.   Overall survival with either of the drugs is likely  to be the same.  Patient understands risks and benefits of all 3 drugs and has decided not to proceed with any endocrine therapy at this time.  As such patient can continue to follow-up with her primary care provider as well as Dr. Hampton Abbot for future yearly mammograms and breast exams.  She does not require follow-up with medical oncology   Thank you for this kind referral and the opportunity to participate in the care of this patient   Visit Diagnosis 1. Atypical ductal hyperplasia of breast   2. Goals of care, counseling/discussion     Dr. Randa Evens, MD, MPH [facility] at [facility] [phone] 10/31/2021

## 2021-11-10 ENCOUNTER — Ambulatory Visit
Admission: RE | Admit: 2021-11-10 | Discharge: 2021-11-10 | Disposition: A | Discharge status: Home / Self Care | Payer: Medicare Other | Source: Ambulatory Visit | Attending: Surgery | Admitting: Surgery

## 2021-11-10 DIAGNOSIS — N6489 Other specified disorders of breast: Secondary | ICD-10-CM | POA: Insufficient documentation

## 2021-11-11 NOTE — Progress Notes (Signed)
11/11/21 Left breast mammogram done yesterday, showing stable area in the upper breast compared to prior.  Recommend bilateral mammogram in 6 months to continue with yearly mammogram.  Results released to patient via [place].  Our office will contact her for an appointment and mammogram in 6 months.  Olean Ree, MD

## 2021-12-14 ENCOUNTER — Other Ambulatory Visit: Payer: Self-pay | Admitting: Family Medicine

## 2021-12-14 DIAGNOSIS — Z780 Asymptomatic menopausal state: Secondary | ICD-10-CM

## 2021-12-16 ENCOUNTER — Telehealth: Payer: Self-pay

## 2021-12-16 ENCOUNTER — Other Ambulatory Visit: Payer: Self-pay

## 2021-12-16 DIAGNOSIS — Z86010 Personal history of colonic polyps: Secondary | ICD-10-CM

## 2021-12-16 NOTE — Telephone Encounter (Signed)
Gastroenterology Pre-Procedure Review  Request Date: 05/04/21 Requesting Physician: Dr. Vicente Males  PATIENT REVIEW QUESTIONS: The patient responded to the following health history questions as indicated:    1. Are you having any GI issues? no 2. Do you have a personal history of Polyps? yes (03/22/18 colonoscopy performed by Dr. Vicente Males polyps were present) 3. Do you have a family history of Colon Cancer or Polyps? no 4. Diabetes Mellitus? no 5. Joint replacements in the past 12 months?no 6. Major health problems in the past 3 months?no 7. Any artificial heart valves, MVP, or defibrillator?no    MEDICATIONS & ALLERGIES:    Patient reports the following regarding taking any anticoagulation/antiplatelet therapy:   Plavix, Coumadin, Eliquis, Xarelto, Lovenox, Pradaxa, Brilinta, or Effient? no Aspirin? no  Patient confirms/reports the following medications:  Current Outpatient Medications  Medication Sig Dispense Refill   bimatoprost (LUMIGAN) 0.01 % SOLN      levothyroxine (SYNTHROID) 88 MCG tablet Take 88 mcg by mouth daily.     No current facility-administered medications for this visit.    Patient confirms/reports the following allergies:  Allergies  Allergen Reactions   Penicillins Swelling    No orders of the defined types were placed in this encounter.   AUTHORIZATION INFORMATION Primary Insurance: 1D#: Group #:  Secondary Insurance: 1D#: Group #:  SCHEDULE INFORMATION: Date: 05/05/22 Time: Location: [facility]

## 2022-02-28 ENCOUNTER — Other Ambulatory Visit: Payer: Self-pay

## 2022-02-28 DIAGNOSIS — N6489 Other specified disorders of breast: Secondary | ICD-10-CM

## 2022-02-28 DIAGNOSIS — N6091 Unspecified benign mammary dysplasia of right breast: Secondary | ICD-10-CM

## 2022-04-20 ENCOUNTER — Telehealth: Payer: Self-pay

## 2022-04-20 DIAGNOSIS — Z86010 Personal history of colonic polyps: Secondary | ICD-10-CM

## 2022-04-20 MED ORDER — NA SULFATE-K SULFATE-MG SULF 17.5-3.13-1.6 GM/177ML PO SOLN: 1.0000 | Freq: Once | ORAL | 0 refills | Status: AC

## 2022-04-20 NOTE — Telephone Encounter (Signed)
Pt lvm requesting to reschedule her 05/05/22 colonoscopy with Dr. Vicente Males.  Her procedure has been rescheduled to 08/04/22 with Dr. Vicente Males.  Endoscopy Dept notified of date change. Referral updated.   Thanks, [place], [place]

## 2022-05-01 ENCOUNTER — Ambulatory Visit
Admission: RE | Admit: 2022-05-01 | Discharge: 2022-05-01 | Disposition: A | Discharge status: Home / Self Care | Payer: Medicare Other | Source: Ambulatory Visit | Attending: Surgery | Admitting: Surgery

## 2022-05-01 DIAGNOSIS — N6091 Unspecified benign mammary dysplasia of right breast: Secondary | ICD-10-CM | POA: Insufficient documentation

## 2022-05-01 DIAGNOSIS — N6489 Other specified disorders of breast: Secondary | ICD-10-CM

## 2022-05-10 ENCOUNTER — Encounter: Payer: Self-pay | Admitting: Surgery

## 2022-05-10 ENCOUNTER — Ambulatory Visit: Payer: Medicare Other | Admitting: Surgery

## 2022-05-10 ENCOUNTER — Other Ambulatory Visit: Payer: Self-pay

## 2022-05-10 VITALS — BP 157/87 | HR 91 | Temp 97.9°F | Ht 63.0 in | Wt 175.0 lb

## 2022-05-10 DIAGNOSIS — N6091 Unspecified benign mammary dysplasia of right breast: Secondary | ICD-10-CM | POA: Diagnosis not present

## 2022-05-10 DIAGNOSIS — N6489 Other specified disorders of breast: Secondary | ICD-10-CM

## 2022-05-10 NOTE — Progress Notes (Signed)
05/10/2022  History of Present Illness: Shirley Christian is a 72 y.o. female status post right breast lumpectomy on 08/11/2021 for atypical ductal hyperplasia.  Patient presents today for follow-up.  She also has been following with mammograms for an area of asymmetry in the left breast.  Her most recent mammogram was on 05/01/2022 which did not show any new findings in the area of asymmetry in the left breast with stable.  Repeat mammogram was recommended for 1 year.  The patient reports that she has been doing very well and denies any issues with the right breast.  Reports only some mild and very occasional discomfort in the right breast which is very transient in nature.  Denies any palpable masses, or nipple changes/drainage.  Denies any pain in the left breast or any other issues there.  Past Medical History: Past Medical History:  Diagnosis Date   Atypical ductal hyperplasia of right breast 05/05/2021   Hypothyroidism      Past Surgical History: Past Surgical History:  Procedure Laterality Date   BREAST BIOPSY     BREAST BIOPSY Right 05/24/2021   stereo bx, asymmetry, "X" clip-ATYPICAL DUCTAL HYPERPLASIA, INVOLVING SMALL FOCUS OF SCLEROSING   BREAST LUMPECTOMY WITH RADIOFREQUENCY TAG IDENTIFICATION Right 08/11/2021   Procedure: BREAST LUMPECTOMY WITH RADIOFREQUENCY TAG IDENTIFICATION;  Surgeon: Olean Ree, MD;  Location: [facility] ORS;  Service: General;  Laterality: Right;   CLOSED REDUCTION SHOULDER DISLOCATION     COLONOSCOPY WITH PROPOFOL N/A 03/22/2018   Procedure: COLONOSCOPY WITH PROPOFOL;  Surgeon: Jonathon Bellows, MD;  Location: [facility] ENDOSCOPY;  Service: Gastroenterology;  Laterality: N/A;    Home Medications: Prior to Admission medications   Medication Sig Start Date End Date Taking? Authorizing Provider  bimatoprost (LUMIGAN) 0.01 % SOLN  10/20/21  Yes [provider]  levothyroxine (SYNTHROID) 88 MCG tablet Take 88 mcg by mouth daily. 06/13/21  Yes [provider]    Allergies: Allergies  Allergen Reactions   Penicillins Swelling    Review of Systems: Review of Systems  Constitutional:  Negative for chills and fever.  Respiratory:  Negative for shortness of breath.   Cardiovascular:  Negative for chest pain.  Gastrointestinal:  Negative for abdominal pain, nausea and vomiting.  Skin:  Negative for rash.    Physical Exam BP (!) 157/87   Pulse 91   Temp 97.9 F (36.6 C) (Oral)   Ht '5\' 3"'$  (1.6 m)   Wt 175 lb (79.4 kg)   SpO2 99%   BMI 31.00 kg/m  CONSTITUTIONAL: Acute distress, well-nourished HEENT:  Normocephalic, atraumatic, extraocular motion intact. RESPIRATORY:  Normal respiratory effort without pathologic use of accessory muscles. CARDIOVASCULAR: Regular rhythm and rate. BREAST: Right breast status postlumpectomy in the upper outer quadrant with scar very well-healed without any significant firmness around it.  No palpable masses, skin changes, or nipple changes.  No right axillary lymphadenopathy.  Left breast without any palpable masses, skin changes, or nipple changes.  No left axillary lymphadenopathy. NEUROLOGIC:  Motor and sensation is grossly normal.  Cranial nerves are grossly intact. PSYCH:  Alert and oriented to person, place and time. Affect is normal.  Labs/Imaging: Bilateral mammogram on 05/01/2022: FINDINGS: Postsurgical changes in the upper outer right breast appears expected. No suspicious findings on the right.   The left asymmetry only identified on the MLO view at a mid to posterior depth above the level of the nipple is stable. The known cluster of cysts in the medial left breast is stable. No other suspicious findings  on the left.   IMPRESSION: Stable probably benign left breast asymmetry as above. No other suspicious findings in either breast.   RECOMMENDATION: Recommend 12 month follow-up diagnostic mammography of the left breast asymmetry. The patient will be due for bilateral mammography at  that time.   I have discussed the findings and recommendations with the patient. If applicable, a reminder letter will be sent to the patient regarding the next appointment.   BI-RADS CATEGORY  3: Probably benign.    Assessment and Plan: This is a 72 y.o. female status post right breast lumpectomy for ADH and a left breast asymmetry.  - Discussed with patient the findings on her exam and on the mammogram.  Overall there are no new suspicious findings and in the left breast the area of asymmetry is stable and thought to be probably benign.  Radiology has recommended follow-up in 12 months with a diagnostic mammogram. - The patient is wondering if she needs to be followed up still with [place].  The patient had an appointment with Dr. Janese Banks on 10/21/2021 at which time the patient decided to forego any endocrine therapy with her ADH.  No further follow-ups were needed from Dr. Janese Banks standpoint.  Discussed with the patient that at least she needs somebody to be following up on her mammograms and performing breast exams on a yearly basis.  Discussed with her that she could have those done with me or with her PCP.  From my standpoint, our next follow-up would be in 6 months for another exam.  Will place her in a recall list and if at the time of appointment set up the patient wishes to continue only with her PCP that would be perfectly fine or if she wants to proceed with [place] will continue seeing her. - All of her questions have been answered.  I spent 20 minutes dedicated to the care of this patient on the date of this encounter to include pre-visit review of records, face-to-face time with the patient discussing diagnosis and management, and any post-visit coordination of care.   Melvyn Neth, [facility]

## 2022-05-10 NOTE — Patient Instructions (Addendum)
We will contact you August  2024 to schedule your mammogram and breast exam. Please call with any questions or concerns you may have.    Breast Self-Awareness Breast self-awareness means being familiar with how your breasts look and feel. It involves checking your breasts regularly and telling your health care provider about any changes. Practicing breast self-awareness helps to maintain breast health. Sometimes, changes are not harmful (are benign). Other times, a change in your breasts can be a sign of a serious medical problem. Being familiar with the look and feel of your breasts can help you catch a breast problem while it is still small and can be treated. You should do breast self-exams even if you have breast implants. What you need: A mirror. A well-lit room. A pillow or other soft object. How to do a breast self-exam A breast self-exam is one way to learn what is normal for your breasts and whether your breasts are changing. To do a breast self-exam: Look for changes  Remove all the clothing above your waist. Stand in front of a mirror in a room with good lighting. Put your hands down at your sides. Compare your breasts in the mirror. Look for differences between them (asymmetry), such as: Differences in shape. Differences in size. Puckers, dips, and bumps in one breast and not the other. Look at each breast for changes in the skin, such as: Redness. Scaly areas. Skin thickening. Dimpling. Open sores (ulcers). Look for changes in your nipples, such as: Discharge. Bleeding. Dimpling. Redness. A nipple that looks pushed in (retracted), or that has changed position. Feel for changes Carefully feel your breasts for lumps and changes. It is best to do this self-exam while lying down. Follow these steps to feel each breast: Place a pillow under the shoulder of one side of your body. Place the arm of that side of your body behind your head. Feel the breast of that side of your  body using the hand of the opposite arm. To do this: Start in the nipple area and use the pads of your three middle fingers to make -inch (2 cm) overlapping circles. Use light, medium, and then firm pressure as you feel your breast, gently covering the entire breast area and armpit. Continue the overlapping circles, moving downward over the breast until you feel your ribs below your breast. Then, make circles with your fingers going upward until you reach your collarbone. Next, make circles by moving outward across your breast and into your armpit area. Squeeze the nipple. Check for discharge and lumps. Repeat steps 1-7 to check your other breast. Sit or stand in the tub or shower. With soapy water on your skin, feel each breast the same way you did when you were lying down. Write down what you find Writing down what you find can help you remember what to discuss with your health care provider. Write down: What is normal for each breast. Any changes that you find in each breast. These include: The kind of changes you find. Any pain or tenderness. Size and location of any lumps. Where you are in your menstrual cycle, if you are still getting your menstrual period (menstruating). General tips If you are breastfeeding, the best time to examine your breasts is after a feeding or after using a breast pump. If you menstruate, the best time to examine your breasts is 5-7 days after your menstrual period. Breasts are generally lumpier during menstrual periods, and it may be more difficult to  notice changes. With time and practice, you will become more familiar with the differences in your breasts and more comfortable with the exam. Contact a health care provider if: You see a change in the shape or size of your breasts or nipples. You see a change in the skin of your breast or nipples, such as a reddened or scaly area. You have unusual discharge from your nipples. You find a new lump or thick  area. You have breast pain. You have any concerns about your breast health. Summary Breast self-awareness includes looking for physical changes in your breasts and feeling for any changes within your breasts. Breast self-awareness should be done in front of a mirror in a well-lit room. If you menstruate, the best time to examine your breasts is 5-7 days after your menstrual period. Tell your health care provider about any changes you notice in your breasts. Changes include changes in size, changes on the skin, pain or tenderness, or unusual fluid from your nipples. This information is not intended to replace advice given to you by your health care provider. Make sure you discuss any questions you have with your health care provider. Document Revised: 07/21/2021 Document Reviewed: 12/16/2020 Elsevier Patient Education  [place].

## 2022-07-28 ENCOUNTER — Encounter: Payer: Self-pay | Admitting: Gastroenterology

## 2022-08-04 ENCOUNTER — Ambulatory Visit
Admission: RE | Admit: 2022-08-04 | Discharge: 2022-08-04 | Disposition: A | Discharge status: Home / Self Care | Payer: Medicare Other | Attending: Gastroenterology | Admitting: Gastroenterology

## 2022-08-04 ENCOUNTER — Other Ambulatory Visit: Payer: Self-pay

## 2022-08-04 ENCOUNTER — Encounter
Admission: RE | Disposition: A | Discharge status: Home / Self Care | Payer: Self-pay | Source: Home / Self Care | Attending: Gastroenterology

## 2022-08-04 ENCOUNTER — Encounter: Payer: Self-pay | Admitting: Gastroenterology

## 2022-08-04 ENCOUNTER — Ambulatory Visit: Payer: Medicare Other | Admitting: Anesthesiology

## 2022-08-04 DIAGNOSIS — Z09 Encounter for follow-up examination after completed treatment for conditions other than malignant neoplasm: Secondary | ICD-10-CM | POA: Diagnosis not present

## 2022-08-04 DIAGNOSIS — D122 Benign neoplasm of ascending colon: Secondary | ICD-10-CM | POA: Insufficient documentation

## 2022-08-04 DIAGNOSIS — Z1211 Encounter for screening for malignant neoplasm of colon: Secondary | ICD-10-CM | POA: Diagnosis present

## 2022-08-04 DIAGNOSIS — Z860100 Personal history of colon polyps, unspecified: Secondary | ICD-10-CM

## 2022-08-04 DIAGNOSIS — E039 Hypothyroidism, unspecified: Secondary | ICD-10-CM | POA: Insufficient documentation

## 2022-08-04 DIAGNOSIS — D126 Benign neoplasm of colon, unspecified: Secondary | ICD-10-CM | POA: Diagnosis not present

## 2022-08-04 HISTORY — PX: COLONOSCOPY WITH PROPOFOL: SHX5780

## 2022-08-04 SURGERY — COLONOSCOPY WITH PROPOFOL
Anesthesia: General

## 2022-08-04 MED ORDER — SODIUM CHLORIDE 0.9 % IV SOLN: INTRAVENOUS | Status: DC

## 2022-08-04 MED ORDER — LIDOCAINE HCL (CARDIAC) PF 100 MG/5ML IV SOSY
PREFILLED_SYRINGE | INTRAVENOUS | Status: DC | PRN
  Administered 2022-08-04: 100 mg via INTRAVENOUS

## 2022-08-04 MED ORDER — PROPOFOL 10 MG/ML IV BOLUS
INTRAVENOUS | Status: DC | PRN
  Administered 2022-08-04: 150 ug/kg/min via INTRAVENOUS
  Administered 2022-08-04: 50 mg via INTRAVENOUS

## 2022-08-04 NOTE — Op Note (Signed)
[facility] Gastroenterology Patient Name: Shirley Christian Procedure Date: 08/04/2022 8:52 AM MRN: [id number] Account #: [id number] Date of Birth: 1950/07/22 Admit Type: Outpatient Age: 72 Room: [facility] ENDO ROOM 4 Gender: Female Note Status: Finalized Instrument Name: Nelda Marseille [id number] Procedure:             Colonoscopy Indications:           Surveillance: Personal history of adenomatous polyps                         on last colonoscopy 3 years ago Providers:             Wyline Mood MD, MD Referring MD:          No Local Md, MD (Referring MD) Medicines:             Monitored Anesthesia Care Complications:         No immediate complications. Procedure:             Pre-Anesthesia Assessment:                        - Prior to the procedure, a History and Physical was                         performed, and patient medications, allergies and                         sensitivities were reviewed. The patient's tolerance                         of previous anesthesia was reviewed.                        - The risks and benefits of the procedure and the                         sedation options and risks were discussed with the                         patient. All questions were answered and informed                         consent was obtained.                        - ASA Grade Assessment: II - A patient with mild                         systemic disease.                        After obtaining informed consent, the colonoscope was                         passed under direct vision. Throughout the procedure,                         the patient's blood pressure, pulse, and oxygen  saturations were monitored continuously. The                         Colonoscope was introduced through the anus and                         advanced to the the cecum, identified by the                         appendiceal orifice. The colonoscopy was performed                          with ease. The patient tolerated the procedure well.                         The quality of the bowel preparation was good. The                         ileocecal valve, appendiceal orifice, and rectum were                         photographed. Findings:      The perianal and digital rectal examinations were normal.      Two sessile polyps were found in the ascending colon. The polyps were 3       to 4 mm in size. These polyps were removed with a jumbo cold forceps.       Resection and retrieval were complete.      A 5 mm polyp was found in the ascending colon. The polyp was sessile.       The polyp was removed with a cold snare. Resection and retrieval were       complete.      The exam was otherwise without abnormality on direct and retroflexion       views. Impression:            - Two 3 to 4 mm polyps in the ascending colon, removed                         with a jumbo cold forceps. Resected and retrieved.                        - One 5 mm polyp in the ascending colon, removed with                         a cold snare. Resected and retrieved.                        - The examination was otherwise normal on direct and                         retroflexion views. Recommendation:        - Discharge patient to home (with escort).                        - Resume previous diet.                        - Continue present medications.                        -  Await pathology results.                        - Repeat colonoscopy for surveillance based on                         pathology results. Procedure Code(s):     --- Professional ---                        [id number], Colonoscopy, flexible; with removal of                         tumor(s), polyp(s), or other lesion(s) by snare                         technique                        45380, 59, Colonoscopy, flexible; with biopsy, single                         or multiple Diagnosis Code(s):     --- Professional ---                         D12.2, Benign neoplasm of ascending colon                        Z86.010, Personal history of colonic polyps CPT copyright 2022 [employer]. All rights reserved. The codes documented in this report are preliminary and upon coder review may  be revised to meet current compliance requirements. Wyline Mood, MD Wyline Mood MD, MD 08/04/2022 9:10:36 AM This report has been signed electronically. Number of Addenda: 0 Note Initiated On: 08/04/2022 8:52 AM Scope Withdrawal Time: 0 hours 10 minutes 54 seconds  Total Procedure Duration: 0 hours 12 minutes 39 seconds  Estimated Blood Loss:  Estimated blood loss: none.      [facility]

## 2022-08-04 NOTE — H&P (Signed)
  Wyline Mood, MD [address], [place], [place], [address] [address], [address], [place], [place], [address] Phone: [phone]  Fax: [phone]  Primary Care Physician:  Shane Crutch, [place]   Pre-Procedure History & Physical: HPI:  Shirley Christian is a 72 y.o. female is here for an colonoscopy.   Past Medical History:  Diagnosis Date   Atypical ductal hyperplasia of right breast 05/05/2021   Hypothyroidism     Past Surgical History:  Procedure Laterality Date   BREAST BIOPSY     BREAST BIOPSY Right 05/24/2021   stereo bx, asymmetry, "X" clip-ATYPICAL DUCTAL HYPERPLASIA, INVOLVING SMALL FOCUS OF SCLEROSING   BREAST LUMPECTOMY WITH RADIOFREQUENCY TAG IDENTIFICATION Right 08/11/2021   Procedure: BREAST LUMPECTOMY WITH RADIOFREQUENCY TAG IDENTIFICATION;  Surgeon: Henrene Dodge, MD;  Location: [facility] ORS;  Service: General;  Laterality: Right;   CLOSED REDUCTION SHOULDER DISLOCATION     COLONOSCOPY WITH PROPOFOL N/A 03/22/2018   Procedure: COLONOSCOPY WITH PROPOFOL;  Surgeon: Wyline Mood, MD;  Location: [facility] ENDOSCOPY;  Service: Gastroenterology;  Laterality: N/A;    Prior to Admission medications   Medication Sig Start Date End Date Taking? Authorizing Provider  bimatoprost (LUMIGAN) 0.01 % SOLN  10/20/21   [provider]  levothyroxine (SYNTHROID) 88 MCG tablet Take 88 mcg by mouth daily. 06/13/21   [provider]    Allergies as of 12/16/2021 - Review Complete 12/16/2021  Allergen Reaction Noted   Penicillins Swelling 03/22/2018    Family History  Problem Relation Age of Onset   Breast cancer Neg Hx     Social History   Socioeconomic History   Marital status: Divorced    Spouse name: Not on file   Number of children: Not on file   Years of education: Not on file   Highest education level: Not on file  Occupational History   Not on file  Tobacco Use   Smoking status: Never   Smokeless tobacco: Never  Vaping Use   Vaping Use:  Never used  Substance and Sexual Activity   Alcohol use: Never   Drug use: Never   Sexual activity: Not on file  Other Topics Concern   Not on file  Social History Narrative   Not on file   Social Determinants of Health   Financial Resource Strain: Not on file  Food Insecurity: Not on file  Transportation Needs: Not on file  Physical Activity: Not on file  Stress: Not on file  Social Connections: Not on file  Intimate Partner Violence: Not on file    Review of Systems: See HPI, otherwise negative ROS  Physical Exam: There were no vitals taken for this visit. General:   Alert,  pleasant and cooperative in NAD Head:  Normocephalic and atraumatic. Neck:  Supple; no masses or thyromegaly. Lungs:  Clear throughout to auscultation, normal respiratory effort.    Heart:  +S1, +S2, Regular rate and rhythm, No edema. Abdomen:  Soft, nontender and nondistended. Normal bowel sounds, without guarding, and without rebound.   Neurologic:  Alert and  oriented x4;  grossly normal neurologically.  Impression/Plan: Shirley Christian is here for an colonoscopy to be performed for surveillance due to prior history of colon polyps   Risks, benefits, limitations, and alternatives regarding  colonoscopy have been reviewed with the patient.  Questions have been answered.  All parties agreeable.   Wyline Mood, MD  08/04/2022, 8:15 AM

## 2022-08-04 NOTE — Anesthesia Preprocedure Evaluation (Addendum)
Anesthesia Evaluation  Patient identified by MRN, date of birth, ID band Patient awake    Reviewed: Allergy & Precautions, NPO status , Patient's Chart, lab work & pertinent test results  History of Anesthesia Complications Negative for: history of anesthetic complications  Airway Mallampati: IV   Neck ROM: Full    Dental  (+) Missing, Chipped   Pulmonary neg pulmonary ROS   Pulmonary exam normal breath sounds clear to auscultation       Cardiovascular Exercise Tolerance: Good negative cardio ROS Normal cardiovascular exam Rhythm:Regular Rate:Normal     Neuro/Psych negative neurological ROS     GI/Hepatic negative GI ROS,,,  Endo/Other  Hypothyroidism  Obesity   Renal/GU negative Renal ROS     Musculoskeletal   Abdominal   Peds  Hematology negative hematology ROS (+)   Anesthesia Other Findings   Reproductive/Obstetrics                             Anesthesia Physical Anesthesia Plan  ASA: 2  Anesthesia Plan: General   Post-op Pain Management:    Induction: Intravenous  PONV Risk Score and Plan: 3 and Propofol infusion, TIVA and Treatment may vary due to age or medical condition  Airway Management Planned: Natural Airway  Additional Equipment:   Intra-op Plan:   Post-operative Plan:   Informed Consent: I have reviewed the patients History and Physical, chart, labs and discussed the procedure including the risks, benefits and alternatives for the proposed anesthesia with the patient or authorized representative who has indicated his/her understanding and acceptance.       Plan Discussed with: CRNA  Anesthesia Plan Comments: (LMA/GETA backup discussed.  Patient consented for risks of anesthesia including but not limited to:  - adverse reactions to medications - damage to eyes, teeth, lips or other oral mucosa - nerve damage due to positioning  - sore throat or  hoarseness - damage to heart, brain, nerves, lungs, other parts of body or loss of life  Informed patient about role of CRNA in peri- and intra-operative care.  Patient voiced understanding.)       Anesthesia Quick Evaluation

## 2022-08-04 NOTE — Transfer of Care (Signed)
Immediate Anesthesia Transfer of Care Note  Patient: Shirley Christian  Procedure(s) Performed: COLONOSCOPY WITH PROPOFOL  Patient Location: PACU and Endoscopy Unit  Anesthesia Type:General  Level of Consciousness: drowsy and patient cooperative  Airway & Oxygen Therapy: Patient Spontanous Breathing  Post-op Assessment: Report given to RN and Post -op Vital signs reviewed and stable  Post vital signs: Reviewed and stable  Last Vitals:  Vitals Value Taken Time  BP 137/118 08/04/22 0912  Temp 36.3 C 08/04/22 0912  Pulse 98 08/04/22 0912  Resp 20 08/04/22 0912  SpO2 100 % 08/04/22 0912    Last Pain:  Vitals:   08/04/22 0912  TempSrc: Temporal  PainSc: 0-No pain         Complications: No notable events documented.

## 2022-08-04 NOTE — Anesthesia Postprocedure Evaluation (Signed)
Anesthesia Post Note  Patient: Shirley Christian  Procedure(s) Performed: COLONOSCOPY WITH PROPOFOL  Patient location during evaluation: PACU Anesthesia Type: General Level of consciousness: awake and alert, oriented and patient cooperative Pain management: pain level controlled Vital Signs Assessment: post-procedure vital signs reviewed and stable Respiratory status: spontaneous breathing, nonlabored ventilation and respiratory function stable Cardiovascular status: blood pressure returned to baseline and stable Postop Assessment: adequate PO intake Anesthetic complications: no   No notable events documented.   Last Vitals:  Vitals:   08/04/22 0912 08/04/22 0922  BP: (!) 137/118 123/66  Pulse: 98 95  Resp: 20   Temp: (!) 36.3 C   SpO2: 100% 100%    Last Pain:  Vitals:   08/04/22 0922  TempSrc:   PainSc: 0-No pain                 Reed Breech

## 2022-08-07 ENCOUNTER — Encounter: Payer: Self-pay | Admitting: Gastroenterology

## 2022-11-08 ENCOUNTER — Ambulatory Visit: Payer: Medicare Other | Admitting: Surgery

## 2022-11-17 ENCOUNTER — Telehealth: Payer: Self-pay

## 2022-11-17 NOTE — Telephone Encounter (Signed)
Can you please make a letter about colonoscopy results that was done on 08/04/2022. Looks like results was never sent to you.

## 2022-11-20 NOTE — Telephone Encounter (Signed)
Called and informed patient and patient verbalized understanding of results  ?

## 2023-01-22 ENCOUNTER — Other Ambulatory Visit: Payer: Self-pay | Admitting: Family Medicine

## 2023-01-22 DIAGNOSIS — Z780 Asymptomatic menopausal state: Secondary | ICD-10-CM

## 2023-01-31 ENCOUNTER — Other Ambulatory Visit: Payer: Self-pay | Admitting: Family Medicine

## 2023-01-31 DIAGNOSIS — Z1231 Encounter for screening mammogram for malignant neoplasm of breast: Secondary | ICD-10-CM

## 2023-01-31 DIAGNOSIS — N6489 Other specified disorders of breast: Secondary | ICD-10-CM

## 2023-03-28 IMAGING — MG MM DIGITAL SCREENING BILAT W/ TOMO AND CAD
8 series · 8 of 24 positions shown · non-contrast
Comparison: Previous exam(s).

CLINICAL DATA: Screening.

EXAM:
DIGITAL SCREENING BILATERAL MAMMOGRAM WITH TOMOSYNTHESIS AND CAD
TECHNIQUE: Bilateral screening digital craniocaudal and mediolateral oblique
mammograms were obtained. Bilateral screening digital breast
tomosynthesis was performed. The images were evaluated with
computer-aided detection.

[L CC synth-2D]
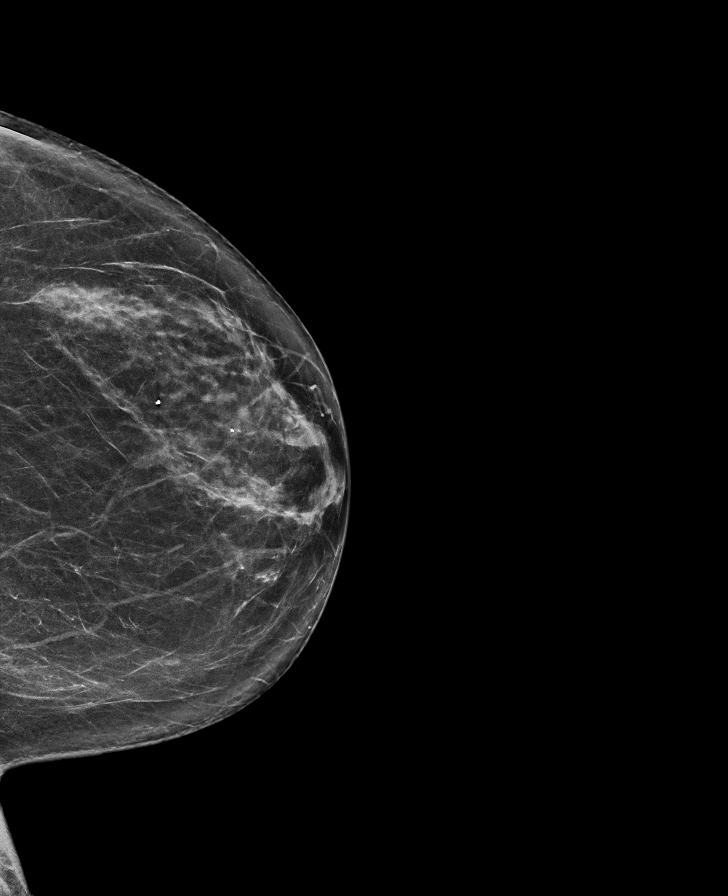

[R MLO synth-2D]
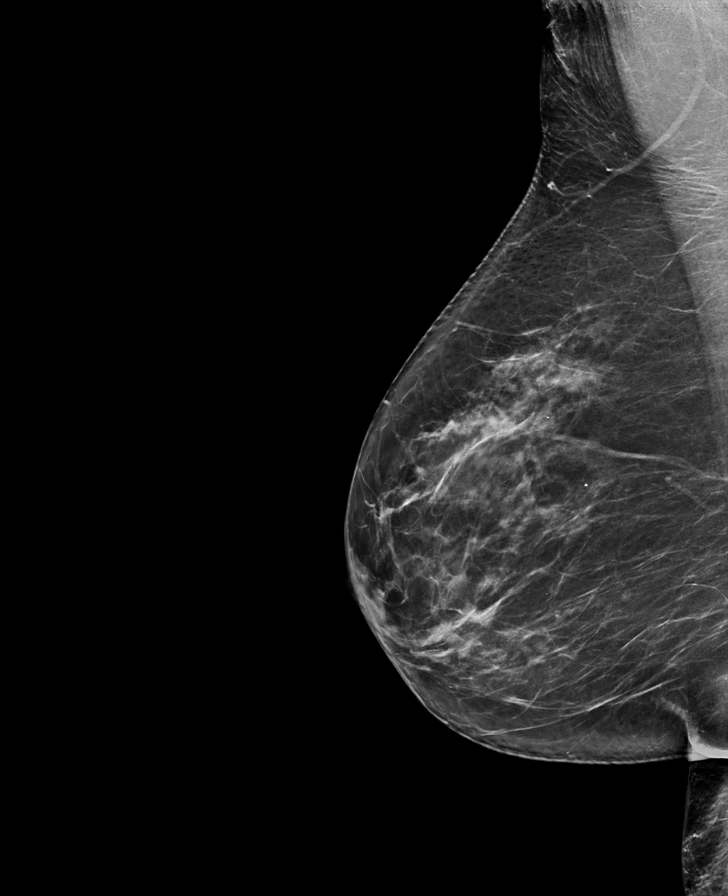

[L MLO synth-2D]
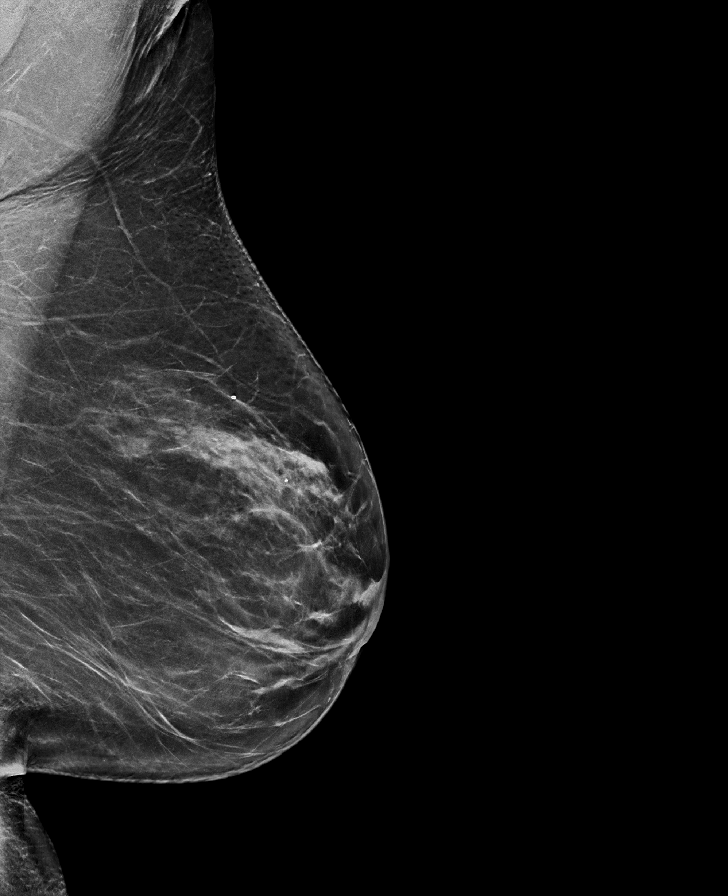

[R CC synth-2D]
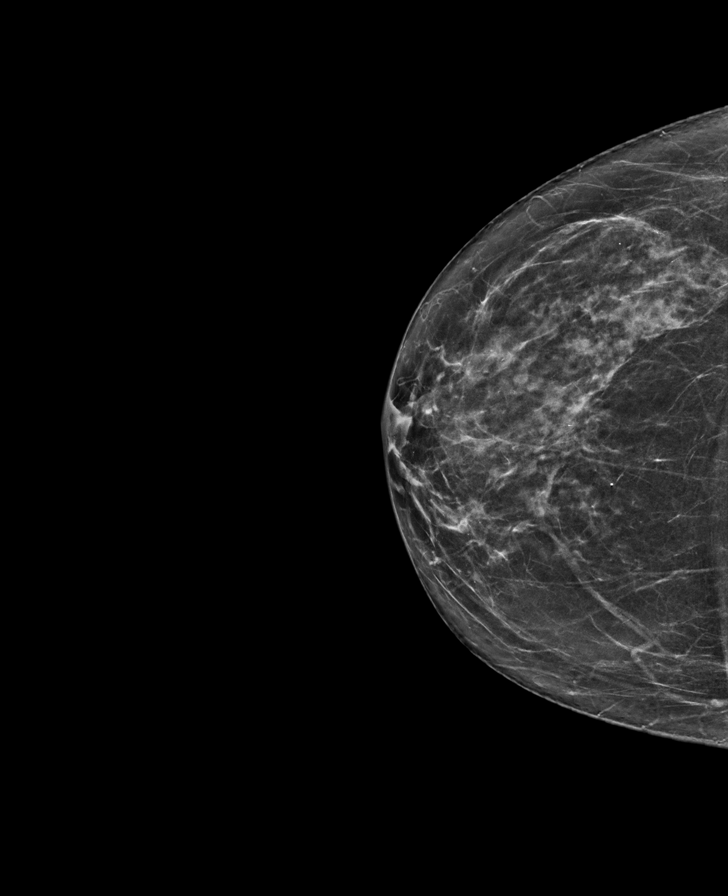

[R CC tomo · tomo slice 35/68.0]
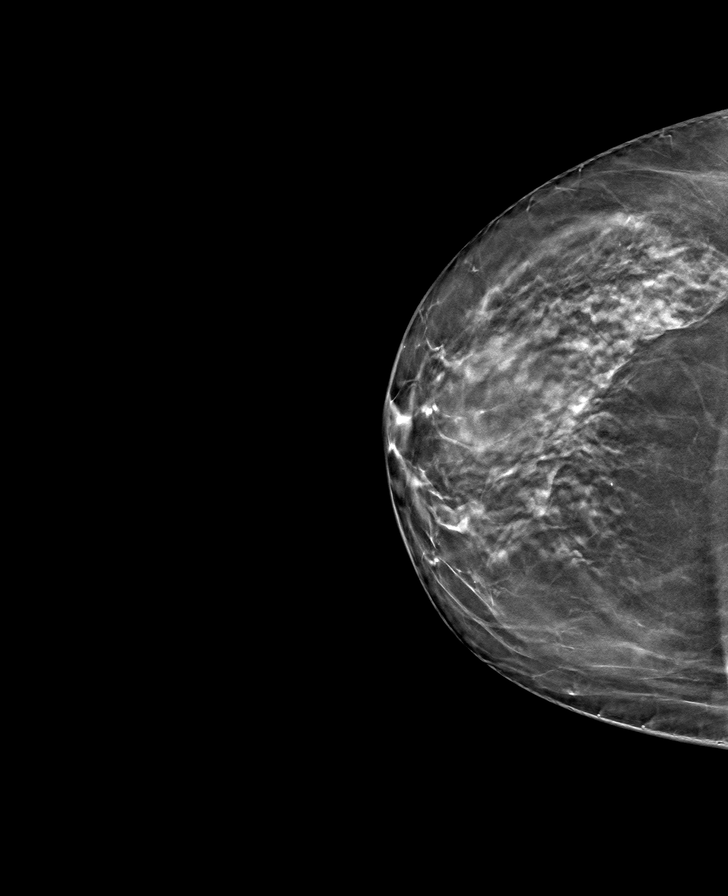

[L CC tomo · tomo slice 37/72.0]
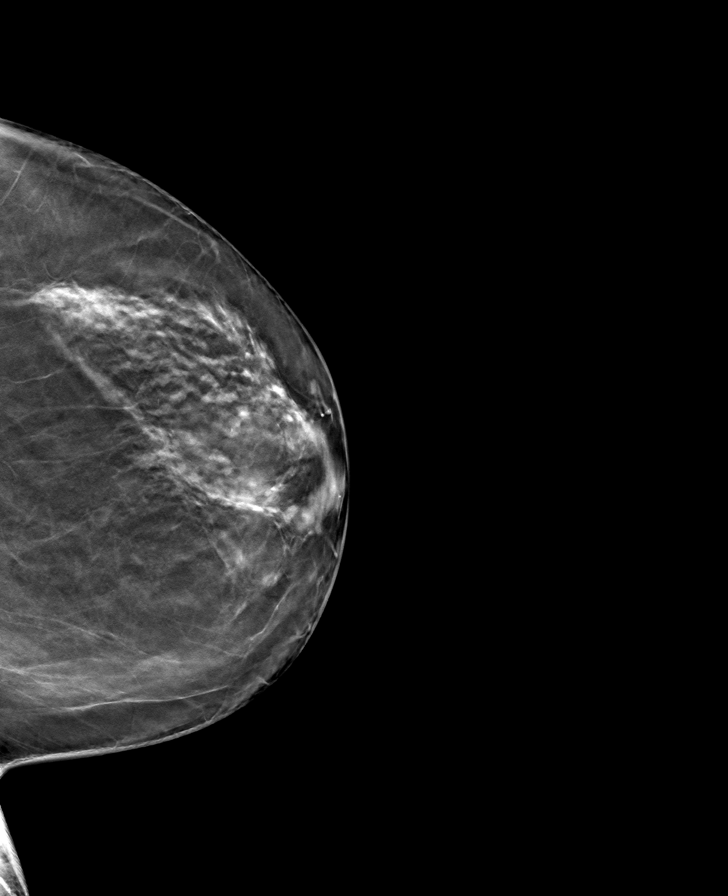

[R MLO tomo · tomo slice 39/76.0]
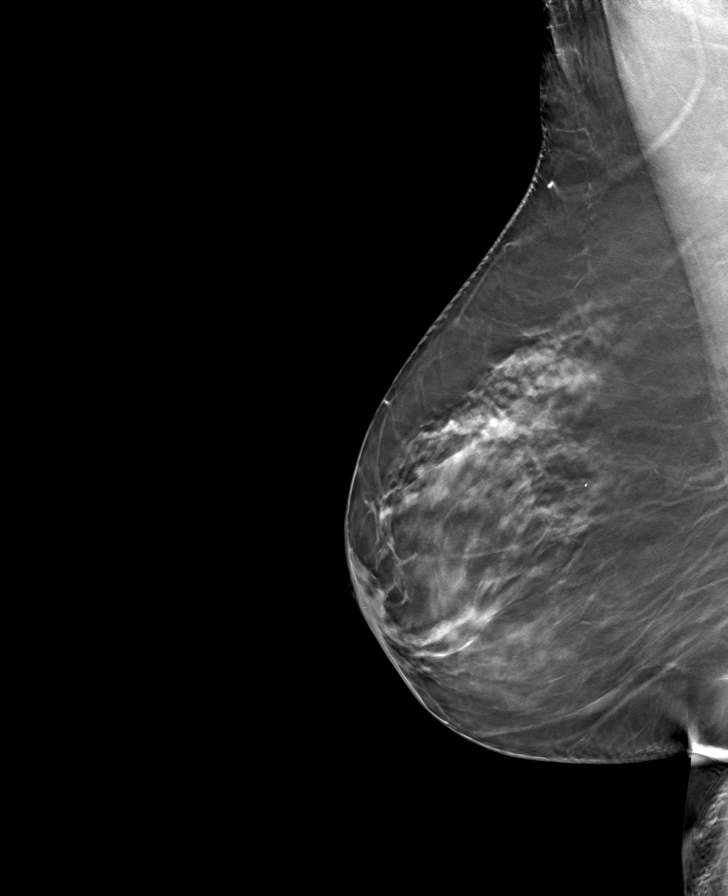

[L MLO tomo · tomo slice 39/77.0]
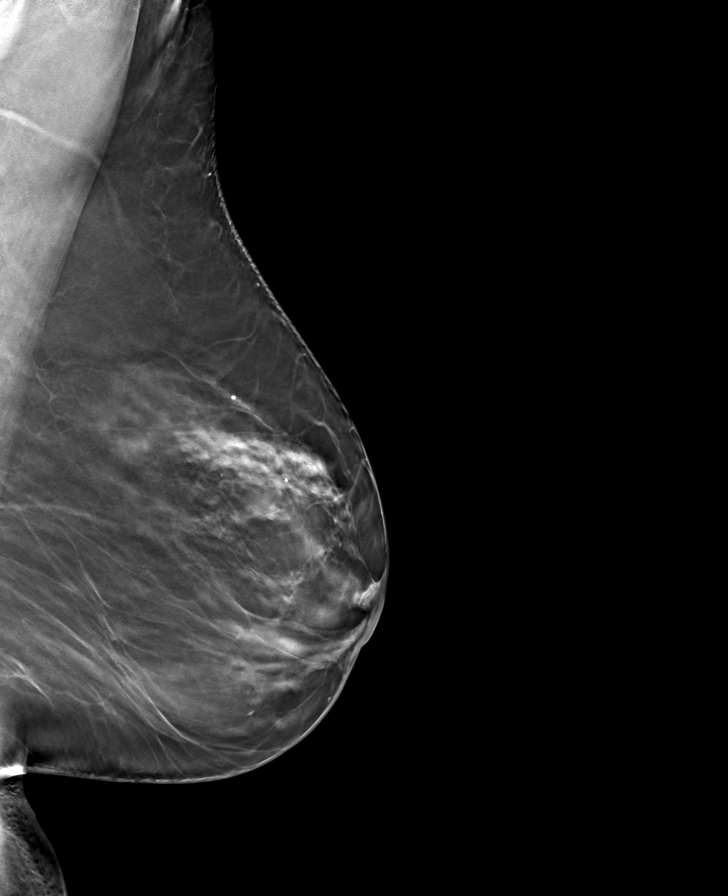

[8 of 24 positions shown; findings below may reference images not displayed]

ACR Breast Density Category c: The breast tissue is heterogeneously
dense, which may obscure small masses.
FINDINGS: In the right breast a possible asymmetry requires further
evaluation.

In the left breast possible asymmetries require further evaluation.
IMPRESSION: Further evaluation is suggested for possible asymmetry in the right
breast.

Further evaluation is suggested for possible asymmetries in the left
breast.

RECOMMENDATION:
Diagnostic mammogram and possibly ultrasound of both breasts.
(Code:LO-B-XX3)

The patient will be contacted regarding the findings, and additional
imaging will be scheduled.

BI-RADS CATEGORY  0: Incomplete. Need additional imaging evaluation
and/or prior mammograms for comparison.

## 2023-04-02 ENCOUNTER — Other Ambulatory Visit: Payer: Self-pay

## 2023-04-02 DIAGNOSIS — N6091 Unspecified benign mammary dysplasia of right breast: Secondary | ICD-10-CM

## 2023-04-02 DIAGNOSIS — N6489 Other specified disorders of breast: Secondary | ICD-10-CM

## 2023-05-08 ENCOUNTER — Ambulatory Visit
Admission: RE | Admit: 2023-05-08 | Discharge: 2023-05-08 | Disposition: A | Discharge status: Home / Self Care | Payer: Medicare Other | Source: Ambulatory Visit | Attending: Surgery | Admitting: Surgery

## 2023-05-08 DIAGNOSIS — N6489 Other specified disorders of breast: Secondary | ICD-10-CM | POA: Diagnosis present

## 2023-05-08 DIAGNOSIS — N6091 Unspecified benign mammary dysplasia of right breast: Secondary | ICD-10-CM | POA: Insufficient documentation

## 2023-05-18 ENCOUNTER — Ambulatory Visit: Payer: Medicare Other | Admitting: Surgery
